# Patient Record
Sex: Male | Born: 1985
Health system: Southern US, Community
[De-identification: ages and names within clinical notes are randomized; demographics above are authoritative.]

## PROBLEM LIST (undated history)

## (undated) DIAGNOSIS — R011 Cardiac murmur, unspecified: Secondary | ICD-10-CM

## (undated) DIAGNOSIS — F1110 Opioid abuse, uncomplicated: Secondary | ICD-10-CM

## (undated) DIAGNOSIS — I499 Cardiac arrhythmia, unspecified: Secondary | ICD-10-CM

## (undated) HISTORY — PX: NO PAST SURGERIES: SHX2092

## (undated) HISTORY — DX: Cardiac arrhythmia, unspecified: I49.9

## (undated) HISTORY — DX: Cardiac murmur, unspecified: R01.1

---

## 2012-03-03 ENCOUNTER — Emergency Department: Payer: Self-pay | Admitting: Emergency Medicine

## 2017-09-07 ENCOUNTER — Other Ambulatory Visit: Payer: Self-pay

## 2017-09-07 ENCOUNTER — Encounter: Payer: Self-pay | Admitting: Emergency Medicine

## 2017-09-07 ENCOUNTER — Emergency Department
Admission: EM | Admit: 2017-09-07 | Discharge: 2017-09-07 | Disposition: A | Discharge status: Home / Self Care | Payer: BLUE CROSS/BLUE SHIELD | Attending: Emergency Medicine | Admitting: Emergency Medicine

## 2017-09-07 DIAGNOSIS — W5501XA Bitten by cat, initial encounter: Secondary | ICD-10-CM | POA: Insufficient documentation

## 2017-09-07 DIAGNOSIS — Z23 Encounter for immunization: Secondary | ICD-10-CM | POA: Insufficient documentation

## 2017-09-07 DIAGNOSIS — Z2914 Encounter for prophylactic rabies immune globin: Secondary | ICD-10-CM | POA: Diagnosis not present

## 2017-09-07 DIAGNOSIS — Y929 Unspecified place or not applicable: Secondary | ICD-10-CM | POA: Insufficient documentation

## 2017-09-07 DIAGNOSIS — Y939 Activity, unspecified: Secondary | ICD-10-CM | POA: Insufficient documentation

## 2017-09-07 DIAGNOSIS — S6991XA Unspecified injury of right wrist, hand and finger(s), initial encounter: Secondary | ICD-10-CM | POA: Diagnosis present

## 2017-09-07 DIAGNOSIS — Y999 Unspecified external cause status: Secondary | ICD-10-CM | POA: Diagnosis not present

## 2017-09-07 DIAGNOSIS — F17200 Nicotine dependence, unspecified, uncomplicated: Secondary | ICD-10-CM | POA: Insufficient documentation

## 2017-09-07 DIAGNOSIS — Z203 Contact with and (suspected) exposure to rabies: Secondary | ICD-10-CM

## 2017-09-07 DIAGNOSIS — S61431A Puncture wound without foreign body of right hand, initial encounter: Secondary | ICD-10-CM | POA: Diagnosis not present

## 2017-09-07 MED ORDER — RABIES IMMUNE GLOBULIN 150 UNIT/ML IM INJ
20.0000 [IU]/kg | INJECTION | Freq: Once | INTRAMUSCULAR | Status: AC
  Administered 2017-09-07: 1800 [IU] via INTRAMUSCULAR
  Filled 2017-09-07: qty 12

## 2017-09-07 MED ORDER — RABIES VACCINE, PCEC IM SUSR
1.0000 mL | Freq: Once | INTRAMUSCULAR | Status: AC
  Administered 2017-09-07: 1 mL via INTRAMUSCULAR
  Filled 2017-09-07: qty 1

## 2017-09-07 MED ORDER — TETANUS-DIPHTH-ACELL PERTUSSIS 5-2.5-18.5 LF-MCG/0.5 IM SUSP
0.5000 mL | Freq: Once | INTRAMUSCULAR | Status: AC
Start: 2017-09-07 — End: 2017-09-07
  Administered 2017-09-07: 0.5 mL via INTRAMUSCULAR
  Filled 2017-09-07: qty 0.5

## 2017-09-07 MED ORDER — AMOXICILLIN-POT CLAVULANATE 875-125 MG PO TABS: 1.0000 | ORAL_TABLET | Freq: Two times a day (BID) | ORAL | 0 refills | Status: DC

## 2017-09-07 NOTE — ED Provider Notes (Signed)
[facility] Emergency Department Provider Note  ____________________________________________  Time seen: Approximately 6:06 PM  I have reviewed the triage vital signs and the nursing notes.   HISTORY  Chief Complaint Animal Bite    HPI Paul Sanders is a 32 y.o. male who presents the emergency department complaining of multiple scratch and bite from a feral cat.  Patient reports that he was at a convenience store, went inside and when he had come out he was informed that I will fill It climbed into his engine block.  Patient had no other way of removing the animal except reaching and pulling them out.  In this process, the cat bit and scratched him multiple times on bilateral upper extremities.  Patient denies any erythema or edema, bleeding, purulent drainage from the sites.  He is concerned due to the unknown history of a cat with multiple wounds from same.  Patient is unsure of his last tetanus shot.  No other injury or complaint.  No medications for this complaint prior to arrival.  History reviewed. No pertinent past medical history.  There are no active problems to display for this patient.   History reviewed. No pertinent surgical history.  Prior to Admission medications   Medication Sig Start Date End Date Taking? Authorizing Provider  buprenorphine-naloxone (SUBOXONE) 8-2 mg SUBL SL tablet Place 1 tablet under the tongue daily.   Yes [provider]  amoxicillin-clavulanate (AUGMENTIN) 875-125 MG tablet Take 1 tablet by mouth 2 (two) times daily. 09/07/17   Dakari Stabler, Delorise Royals, PA-C    Allergies Patient has no known allergies.  History reviewed. No pertinent family history.  Social History Social History   Tobacco Use  . Smoking status: Current Every Day Smoker  . Smokeless tobacco: Never Used  Substance Use Topics  . Alcohol use: Never    Frequency: Never  . Drug use: Never     Review of Systems  Constitutional: No  fever/chills Eyes: No visual changes.  Cardiovascular: no chest pain. Respiratory: no cough. No SOB. Gastrointestinal: No abdominal pain.  No nausea, no vomiting.   Musculoskeletal: Negative for musculoskeletal pain. Skin: Positive for multiple bites from a feral cat to the bilateral hands, forearms. Neurological: Negative for headaches, focal weakness or numbness. 10-point ROS otherwise negative.  ____________________________________________   PHYSICAL EXAM:  VITAL SIGNS: ED Triage Vitals  Enc Vitals Group     BP 09/07/17 1743 117/75     Pulse Rate 09/07/17 1743 83     Resp 09/07/17 1743 16     Temp 09/07/17 1743 98.1 F (36.7 C)     Temp Source 09/07/17 1743 Oral     SpO2 09/07/17 1743 96 %     Weight 09/07/17 1742 200 lb (90.7 kg)     Height 09/07/17 1742  (1.803 m)     Head Circumference --      Peak Flow --      Pain Score 09/07/17 1741 0     Pain Loc --      Pain Edu? --      Excl. in GC? --      Constitutional: Alert and oriented. Well appearing and in no acute distress. Eyes: Conjunctivae are normal. PERRL. EOMI. Head: Atraumatic. Neck: No stridor.    Cardiovascular: Normal rate, regular rhythm. Normal S1 and S2.  Good peripheral circulation. Respiratory: Normal respiratory effort without tachypnea or retractions. Lungs CTAB. Good air entry to the bases with no decreased or absent breath sounds. Musculoskeletal:  Full range of motion to all extremities. No gross deformities appreciated. Neurologic:  Normal speech and language. No gross focal neurologic deficits are appreciated.  Skin:  Skin is warm, dry and intact. No rash noted.  Both superficial puncture wounds, scratches noted to bilateral hands and forearms.  No significant lacerations.  No wound requiring treatment.  All wounds have scabbed over at this time.  No surrounding erythema or edema.  Areas are nontender to palpation.  No purulent drainage from any. Psychiatric: Mood and affect are normal.  Speech and behavior are normal. Patient exhibits appropriate insight and judgement.   ____________________________________________   LABS (all labs ordered are listed, but only abnormal results are displayed)  Labs Reviewed - No data to display ____________________________________________  EKG   ____________________________________________  RADIOLOGY   No results found.  ____________________________________________    PROCEDURES  Procedure(s) performed:    Procedures    Medications  rabies vaccine (RABAVERT) injection 1 mL (has no administration in time range)  rabies immune globulin (HYPERAB/KEDRAB) injection 1,800 Units (has no administration in time range)  Tdap (BOOSTRIX) injection 0.5 mL (has no administration in time range)     ____________________________________________   INITIAL IMPRESSION / ASSESSMENT AND PLAN / ED COURSE  Pertinent labs & imaging results that were available during my care of the patient were reviewed by me and considered in my medical decision making (see chart for details).  Review of the [place] CSRS was performed in accordance of the [facility] prior to dispensing any controlled drugs.     Patient's diagnosis is consistent with cat bite/scratches.  Patient presents with multiple cat bites, scratches from a feral cat.  Patient is here for tetanus, rabies vaccination.  No wounds requiring immediate treatment.  Patient will be placed on antibiotics prophylactically.  Tetanus shot updated.. Patient will be discharged home with prescriptions for Augmentin.  Patient will return in 3 days for second rabies vaccination.. Patient is to follow up with [facility] as needed or otherwise directed. Patient is given ED precautions to return to the ED for any worsening or new symptoms.     ____________________________________________  FINAL CLINICAL IMPRESSION(S) / ED DIAGNOSES  Final diagnoses:  Cat bite, initial encounter  Need for post exposure  prophylaxis for rabies      NEW MEDICATIONS STARTED DURING THIS VISIT:  ED Discharge Orders        Ordered    amoxicillin-clavulanate (AUGMENTIN) 875-125 MG tablet  2 times daily     09/07/17 2109          This chart was dictated using voice recognition software/Dragon. Despite best efforts to proofread, errors can occur which can change the meaning. Any change was purely unintentional.    Racheal Patches, PA-C 09/07/17 2112    Sharyn Creamer, MD 09/08/17 [phone]

## 2017-09-07 NOTE — ED Notes (Addendum)
See triage note  States he was bitten by stray cat   Here for possible rabies injection  States the cat was under his car   When he tried to removed the cat he got several scratches and small bites to hand

## 2017-09-07 NOTE — ED Triage Notes (Signed)
FIRST NURSE NOTE-here for rabies, bit by stray cat

## 2017-09-07 NOTE — ED Triage Notes (Signed)
See first nurse note. 

## 2017-09-10 ENCOUNTER — Other Ambulatory Visit: Payer: Self-pay

## 2017-09-10 ENCOUNTER — Emergency Department
Admission: EM | Admit: 2017-09-10 | Discharge: 2017-09-10 | Disposition: A | Discharge status: Home / Self Care | Payer: BLUE CROSS/BLUE SHIELD | Attending: Emergency Medicine | Admitting: Emergency Medicine

## 2017-09-10 ENCOUNTER — Encounter: Payer: Self-pay | Admitting: Emergency Medicine

## 2017-09-10 DIAGNOSIS — Z79899 Other long term (current) drug therapy: Secondary | ICD-10-CM | POA: Diagnosis not present

## 2017-09-10 DIAGNOSIS — Z23 Encounter for immunization: Secondary | ICD-10-CM | POA: Insufficient documentation

## 2017-09-10 DIAGNOSIS — Z203 Contact with and (suspected) exposure to rabies: Secondary | ICD-10-CM | POA: Insufficient documentation

## 2017-09-10 DIAGNOSIS — F17200 Nicotine dependence, unspecified, uncomplicated: Secondary | ICD-10-CM | POA: Insufficient documentation

## 2017-09-10 MED ORDER — RABIES VACCINE, PCEC IM SUSR
1.0000 mL | Freq: Once | INTRAMUSCULAR | Status: AC
  Administered 2017-09-10: 1 mL via INTRAMUSCULAR
  Filled 2017-09-10: qty 1

## 2017-09-10 NOTE — ED Provider Notes (Signed)
[facility] Emergency Department Provider Note  ____________________________________________  Time seen: Approximately 4:47 PM  I have reviewed the triage vital signs and the nursing notes.   HISTORY  Chief Complaint Follow-up and Rabies Injection    HPI Paul Sanders is a 32 y.o. male who presents emergency department for his second rabies vaccination.  Patient was seen 3 days ago by myself after being bit and scratched by a feral cat to bilateral upper extremities.  See previous note for details.  Patient denies any complications with bite and scratch marks.  Patient is taking his antibiotics.  Patient denies any complaints with a previous rabies vaccine.  Patient is here for second shot only.  No other complaints.  History reviewed. No pertinent past medical history.  There are no active problems to display for this patient.   History reviewed. No pertinent surgical history.  Prior to Admission medications   Medication Sig Start Date End Date Taking? Authorizing Provider  amoxicillin-clavulanate (AUGMENTIN) 875-125 MG tablet Take 1 tablet by mouth 2 (two) times daily. 09/07/17   Cuthriell, Delorise Royals, PA-C  buprenorphine-naloxone (SUBOXONE) 8-2 mg SUBL SL tablet Place 1 tablet under the tongue daily.    [provider]    Allergies Patient has no known allergies.  No family history on file.  Social History Social History   Tobacco Use  . Smoking status: Current Every Day Smoker  . Smokeless tobacco: Never Used  Substance Use Topics  . Alcohol use: Never    Frequency: Never  . Drug use: Never     Review of Systems  Constitutional: No fever/chills Cardiovascular: no chest pain. Respiratory: no cough. No SOB. Gastrointestinal: No abdominal pain.  No nausea, no vomiting.   Musculoskeletal: Negative for musculoskeletal pain. Skin: Previous cat bite and scratches to the bilateral upper extremities Neurological: Negative for  headaches, focal weakness or numbness. 10-point ROS otherwise negative.  ____________________________________________   PHYSICAL EXAM:  VITAL SIGNS: ED Triage Vitals  Enc Vitals Group     BP 09/10/17 1644 132/79     Pulse Rate 09/10/17 1644 87     Resp 09/10/17 1644 16     Temp 09/10/17 1644 98.2 F (36.8 C)     Temp Source 09/10/17 1644 Oral     SpO2 09/10/17 1644 98 %     Weight 09/10/17 1641 200 lb (90.7 kg)     Height 09/10/17 1641  (1.778 m)     Head Circumference --      Peak Flow --      Pain Score --      Pain Loc --      Pain Edu? --      Excl. in GC? --      Constitutional: Alert and oriented. Well appearing and in no acute distress. Eyes: Conjunctivae are normal. PERRL. EOMI. Head: Atraumatic. Neck: No stridor.    Cardiovascular: Normal rate, regular rhythm. Normal S1 and S2.  Good peripheral circulation. Respiratory: Normal respiratory effort without tachypnea or retractions. Lungs CTAB. Good air entry to the bases with no decreased or absent breath sounds. Musculoskeletal: Full range of motion to all extremities. No gross deformities appreciated. Neurologic:  Normal speech and language. No gross focal neurologic deficits are appreciated.  Skin:  Skin is warm, dry and intact. No rash noted.  Well-healing wounds to bilateral upper extremity from previous Bite/scratch. Psychiatric: Mood and affect are normal. Speech and behavior are normal. Patient exhibits appropriate insight and judgement.  ____________________________________________   LABS (all labs ordered are listed, but only abnormal results are displayed)  Labs Reviewed - No data to display ____________________________________________  EKG   ____________________________________________  RADIOLOGY   No results found.  ____________________________________________    PROCEDURES  Procedure(s) performed:    Procedures    Medications  rabies vaccine (RABAVERT) injection 1 mL  (has no administration in time range)     ____________________________________________   INITIAL IMPRESSION / ASSESSMENT AND PLAN / ED COURSE  Pertinent labs & imaging results that were available during my care of the patient were reviewed by me and considered in my medical decision making (see chart for details).  Review of the [place] CSRS was performed in accordance of the [facility] prior to dispensing any controlled drugs.     Patient's diagnosis is consistent with encounter for rabies vaccination.  Patient is here for second injection.  No complications with first.  Patient still on antibiotics.  Wounds are healing appropriately with no signs of infection.  Second shot administered.  Patient will follow-up in 4 days for third shot.  No prescriptions at this time. Patient is given ED precautions to return to the ED for any worsening or new symptoms.     ____________________________________________  FINAL CLINICAL IMPRESSION(S) / ED DIAGNOSES  Final diagnoses:  Need for rabies vaccination      NEW MEDICATIONS STARTED DURING THIS VISIT:  ED Discharge Orders    None          This chart was dictated using voice recognition software/Dragon. Despite best efforts to proofread, errors can occur which can change the meaning. Any change was purely unintentional.    Racheal Patches, PA-C 09/10/17 1654    Phineas Semen, MD 09/10/17 2017

## 2017-09-10 NOTE — ED Triage Notes (Signed)
Follow up shot for rabies.

## 2017-09-14 ENCOUNTER — Other Ambulatory Visit: Payer: Self-pay

## 2017-09-14 ENCOUNTER — Encounter: Payer: Self-pay | Admitting: Emergency Medicine

## 2017-09-14 ENCOUNTER — Emergency Department
Admission: EM | Admit: 2017-09-14 | Discharge: 2017-09-14 | Disposition: A | Discharge status: Home / Self Care | Payer: BLUE CROSS/BLUE SHIELD | Attending: Emergency Medicine | Admitting: Emergency Medicine

## 2017-09-14 DIAGNOSIS — Z23 Encounter for immunization: Secondary | ICD-10-CM | POA: Diagnosis not present

## 2017-09-14 MED ORDER — RABIES VACCINE, PCEC IM SUSR
1.0000 mL | Freq: Once | INTRAMUSCULAR | Status: AC
  Administered 2017-09-14: 1 mL via INTRAMUSCULAR
  Filled 2017-09-14: qty 1

## 2017-09-14 NOTE — ED Provider Notes (Signed)
[facility] Emergency Department Provider Note  ____________________________________________   First MD Initiated Contact with Patient 09/14/17 1749     (approximate)  I have reviewed the triage vital signs and the nursing notes.   HISTORY  Chief Complaint Rabies Injection    HPI Paul Sanders is a 32 y.o. male resents emergency department for rabies injection #3.  He states he is to return next Thursday for repeat rabies vaccine.  He states he has not had any difficulty with the vaccines and has taken the antibiotics appropriately.  He states he does have a bruise on the left thigh due to the rabies vaccine  History reviewed. No pertinent past medical history.  There are no active problems to display for this patient.   History reviewed. No pertinent surgical history.  Prior to Admission medications   Medication Sig Start Date End Date Taking? Authorizing Provider  amoxicillin-clavulanate (AUGMENTIN) 875-125 MG tablet Take 1 tablet by mouth 2 (two) times daily. 09/07/17   Cuthriell, Delorise Royals, PA-C  buprenorphine-naloxone (SUBOXONE) 8-2 mg SUBL SL tablet Place 1 tablet under the tongue daily.    [provider]    Allergies Patient has no known allergies.  No family history on file.  Social History Social History   Tobacco Use  . Smoking status: Current Every Day Smoker  . Smokeless tobacco: Never Used  Substance Use Topics  . Alcohol use: Never    Frequency: Never  . Drug use: Never    Review of Systems  Constitutional: No fever/chills Eyes: No visual changes. ENT: No sore throat. Respiratory: Denies cough Genitourinary: Negative for dysuria. Musculoskeletal: Negative for back pain. Skin: Negative for rash.  Positive bruise to the left thigh due to vaccine    ____________________________________________   PHYSICAL EXAM:  VITAL SIGNS: ED Triage Vitals  Enc Vitals Group     BP 09/14/17 1745 131/83     Pulse  Rate 09/14/17 1745 80     Resp 09/14/17 1745 18     Temp 09/14/17 1745 98.2 F (36.8 C)     Temp Source 09/14/17 1745 Oral     SpO2 09/14/17 1745 99 %     Weight 09/14/17 1737 200 lb (90.7 kg)     Height 09/14/17 1737  (1.778 m)     Head Circumference --      Peak Flow --      Pain Score 09/14/17 1737 0     Pain Loc --      Pain Edu? --      Excl. in GC? --     Constitutional: Alert and oriented. Well appearing and in no acute distress. Eyes: Conjunctivae are normal.  Head: Atraumatic. Nose: No congestion/rhinnorhea. Mouth/Throat: Mucous membranes are moist.   Cardiovascular: Normal rate, regular rhythm. Respiratory: Normal respiratory effort.  No retractions GU: deferred Musculoskeletal: FROM all extremities, warm and well perfused Neurologic:  Normal speech and language.  Skin:  Skin is warm, dry and intact. No rash noted.  Bruise to the left thigh Psychiatric: Mood and affect are normal. Speech and behavior are normal.  ____________________________________________   LABS (all labs ordered are listed, but only abnormal results are displayed)  Labs Reviewed - No data to display ____________________________________________   ____________________________________________  RADIOLOGY    ____________________________________________   PROCEDURES  Procedure(s) performed: No  Procedures    ____________________________________________   INITIAL IMPRESSION / ASSESSMENT AND PLAN / ED COURSE  Pertinent labs & imaging results that were available  during my care of the patient were reviewed by me and considered in my medical decision making (see chart for details).  Patient is a 32 year old male presents emergency department for rabies vaccine #3.  He denies any issues with his previous vaccines and has been taking the antibiotic appropriately  On physical exam patient appears well, is afebrile, and a small bruise on the left thigh secondary to a injection of  the thigh.  Patient was instructed to return as scheduled next Thursday for repeat rabies vaccine.  He states he understands comply with instructions.  Was discharged in stable condition     As part of my medical decision making, I reviewed the following data within the electronic MEDICAL RECORD NUMBER Nursing notes reviewed and incorporated, Old chart reviewed, Notes from prior ED visits and [place] Controlled Substance Database  ____________________________________________   FINAL CLINICAL IMPRESSION(S) / ED DIAGNOSES  Final diagnoses:  Encounter for repeat administration of rabies vaccination      NEW MEDICATIONS STARTED DURING THIS VISIT:  New Prescriptions   No medications on file     Note:  This document was prepared using Dragon voice recognition software and may include unintentional dictation errors.    Faythe Ghee, PA-C 09/14/17 1755    Schaevitz, Myra Rude, MD 09/14/17 [phone]

## 2017-09-14 NOTE — ED Triage Notes (Signed)
Here for 3 rabies injection

## 2017-09-21 ENCOUNTER — Encounter: Payer: Self-pay | Admitting: Emergency Medicine

## 2017-09-21 ENCOUNTER — Other Ambulatory Visit: Payer: Self-pay

## 2017-09-21 ENCOUNTER — Emergency Department
Admission: EM | Admit: 2017-09-21 | Discharge: 2017-09-21 | Disposition: A | Discharge status: Home / Self Care | Payer: BLUE CROSS/BLUE SHIELD | Attending: Emergency Medicine | Admitting: Emergency Medicine

## 2017-09-21 DIAGNOSIS — Z23 Encounter for immunization: Secondary | ICD-10-CM | POA: Diagnosis not present

## 2017-09-21 DIAGNOSIS — Z203 Contact with and (suspected) exposure to rabies: Secondary | ICD-10-CM | POA: Insufficient documentation

## 2017-09-21 MED ORDER — RABIES VACCINE, PCEC IM SUSR
1.0000 mL | Freq: Once | INTRAMUSCULAR | Status: AC
  Administered 2017-09-21: 1 mL via INTRAMUSCULAR

## 2017-09-21 MED ORDER — RABIES VACCINE, PCEC IM SUSR
INTRAMUSCULAR | Status: AC
  Administered 2017-09-21: 1 mL via INTRAMUSCULAR
  Filled 2017-09-21: qty 1

## 2017-09-21 NOTE — ED Provider Notes (Signed)
[facility] Emergency Department Provider Note  ____________________________________________  Time seen: Approximately 7:59 PM  I have reviewed the triage vital signs and the nursing notes.   HISTORY  Chief Complaint Rabies Injection   HPI Paul Sanders is a 32 y.o. male who presents to the emergency department for his fourth and final rabies injection.  He states that he has not had any difficulty with the vaccinations and has taken his antibiotics as prescribed.  History reviewed. No pertinent past medical history.  There are no active problems to display for this patient.   History reviewed. No pertinent surgical history.  Prior to Admission medications   Medication Sig Start Date End Date Taking? Authorizing Provider  amoxicillin-clavulanate (AUGMENTIN) 875-125 MG tablet Take 1 tablet by mouth 2 (two) times daily. 09/07/17   Cuthriell, Delorise Royals, PA-C  buprenorphine-naloxone (SUBOXONE) 8-2 mg SUBL SL tablet Place 1 tablet under the tongue daily.    [provider]    Allergies Patient has no known allergies.  No family history on file.  Social History Social History   Tobacco Use  . Smoking status: Current Every Day Smoker  . Smokeless tobacco: Never Used  Substance Use Topics  . Alcohol use: Never    Frequency: Never  . Drug use: Never    Review of Systems Constitutional: Negative for fever. ENT: Negative for sore throat. Respiratory: Negative for cough or shortness of breath Gastrointestinal: No abdominal pain.  No nausea, no vomiting.  No diarrhea.  Musculoskeletal: Negative for generalized body aches. Skin: Negative for rash/lesion/wound. Neurological: Negative for headaches, focal weakness or numbness.  ____________________________________________   PHYSICAL EXAM:  VITAL SIGNS: ED Triage Vitals  Enc Vitals Group     BP 09/21/17 1857 138/83     Pulse Rate 09/21/17 1857 92     Resp 09/21/17 1857 18     Temp  09/21/17 1857 98.2 F (36.8 C)     Temp Source 09/21/17 1857 Oral     SpO2 09/21/17 1857 98 %     Weight 09/21/17 1858 200 lb (90.7 kg)     Height 09/21/17 1858  (1.778 m)     Head Circumference --      Peak Flow --      Pain Score 09/21/17 1858 0     Pain Loc --      Pain Edu? --      Excl. in GC? --     Constitutional: Alert and oriented. Well appearing and in no acute distress. Eyes: Conjunctivae are normal. PERRL. EOMI. Head: Atraumatic. Nose: No congestion/rhinnorhea. Mouth/Throat: Mucous membranes are moist. Neck: No stridor.  Cardiovascular: Normal rate, regular rhythm. Good peripheral circulation. Respiratory: Normal respiratory effort. Musculoskeletal: Full ROM throughout.  Neurologic:  Normal speech and language. No gross focal neurologic deficits are appreciated. Speech is normal. No gait instability. Skin:  Skin is warm, dry and intact. No rash noted. Psychiatric: Mood and affect are normal. Speech and behavior are normal.  ____________________________________________   LABS (all labs ordered are listed, but only abnormal results are displayed)  Labs Reviewed - No data to display ____________________________________________  EKG  Not indicated ____________________________________________  RADIOLOGY  Not indicated ____________________________________________   PROCEDURES  None ____________________________________________   INITIAL IMPRESSION / ASSESSMENT AND PLAN / ED COURSE  32 year old male presenting to the emergency department for his fourth and final rabies vaccination.  He was advised to follow-up with primary care or return to the emergency department for symptoms of concern.  Pertinent labs & imaging results that were available during my care of the patient were reviewed by me and considered in my medical decision making (see chart for details). ____________________________________________   FINAL CLINICAL IMPRESSION(S) / ED  DIAGNOSES  Final diagnoses:  Need for prophylactic vaccination and inoculation against rabies       Chinita Pester, FNP 09/21/17 2256    Sharman Cheek, MD 09/27/17 [phone]

## 2017-09-21 NOTE — ED Triage Notes (Signed)
Here for fourth rabies injection.  

## 2018-09-21 ENCOUNTER — Other Ambulatory Visit: Payer: Self-pay

## 2018-09-21 ENCOUNTER — Encounter: Payer: Self-pay | Admitting: Emergency Medicine

## 2018-09-21 ENCOUNTER — Ambulatory Visit
Admission: EM | Admit: 2018-09-21 | Discharge: 2018-09-21 | Disposition: A | Discharge status: Home / Self Care | Payer: BLUE CROSS/BLUE SHIELD | Attending: Family Medicine | Admitting: Family Medicine

## 2018-09-21 DIAGNOSIS — J029 Acute pharyngitis, unspecified: Secondary | ICD-10-CM

## 2018-09-21 DIAGNOSIS — H6692 Otitis media, unspecified, left ear: Secondary | ICD-10-CM

## 2018-09-21 HISTORY — DX: Opioid abuse, uncomplicated: F11.10

## 2018-09-21 LAB — RAPID STREP SCREEN (MED CTR MEBANE ONLY): Streptococcus, Group A Screen (Direct): NEGATIVE

## 2018-09-21 MED ORDER — AMOXICILLIN-POT CLAVULANATE 875-125 MG PO TABS: 1.0000 | ORAL_TABLET | Freq: Two times a day (BID) | ORAL | 0 refills | Status: DC

## 2018-09-21 NOTE — ED Triage Notes (Signed)
Patient c/o ear pain that started 1 week ago. Patient also c/o sore throat that started yesterday.

## 2018-09-21 NOTE — ED Provider Notes (Signed)
[facility] ____________________________________________  Time seen: Approximately 7:10 PM  I have reviewed the triage vital signs and the nursing notes.   HISTORY  Chief Complaint Otalgia (APPT) and Sore Throat   HPI Paul Sanders is a 33 y.o. male presenting for evaluation of left ear pain discomfort present for the last 1 week.  States the discomfort has been gradually increasing, particularly over the last 3 days.  Also reports yesterday the left side of his throat started to hurt as well.  States feels like there is some fluid in his left ear.  Denies hearing changes.  Denies injury or trauma.  States he has had occasional congestion going down into his chest, and rare cough.  Denies nasal congestion, sinus pain, fevers, chest pain, shortness of breath or known sick contacts.  Continues to eat and drink well.  Denies aggravating or alleviating factors.  Does work outside and has occasional allergy issues.     Past Medical History:  Diagnosis Date  . Opioid abuse (HCC)     There are no active problems to display for this patient.   History reviewed. No pertinent surgical history.   No current facility-administered medications for this encounter.   Current Outpatient Medications:  .  buprenorphine-naloxone (SUBOXONE) 8-2 mg SUBL SL tablet, Place 1 tablet under the tongue daily., Disp: , Rfl:  .  amoxicillin-clavulanate (AUGMENTIN) 875-125 MG tablet, Take 1 tablet by mouth every 12 (twelve) hours., Disp: 20 tablet, Rfl: 0  Allergies Patient has no known allergies.  History reviewed. No pertinent family history.  Social History Social History   Tobacco Use  . Smoking status: Former Games developer  . Smokeless tobacco: Never Used  Substance Use Topics  . Alcohol use: Never    Frequency: Never  . Drug use: Never    Review of Systems Constitutional: No fever ENT: Positive sore throat. Positive left ear discomfort.  Cardiovascular: Denies chest pain.  Respiratory: Denies shortness of breath. Gastrointestinal: No abdominal pain.    Musculoskeletal: Negative for back pain. Skin: Negative for rash.   ____________________________________________   PHYSICAL EXAM:  VITAL SIGNS: ED Triage Vitals  Enc Vitals Group     BP 09/21/18 1649 (!) 147/98     Pulse Rate 09/21/18 1649 80     Resp 09/21/18 1649 18     Temp 09/21/18 1649 98.2 F (36.8 C)     Temp Source 09/21/18 1649 Oral     SpO2 09/21/18 1649 100 %     Weight 09/21/18 1650 202 lb (91.6 kg)     Height 09/21/18 1650 5\' 10"  (1.778 m)     Head Circumference --      Peak Flow --      Pain Score 09/21/18 1650 3     Pain Loc --      Pain Edu? --      Excl. in GC? --     Constitutional: Alert and oriented. Well appearing and in no acute distress. Eyes: Conjunctivae are normal.  Head: Atraumatic. No sinus tenderness to palpation. No swelling. No erythema.  Ears: Left: Nontender, normal canal, moderate erythema and dull TM.  Right: Nontender, normal canal, no erythema, slight effusion, otherwise normal TM.  No mastoid tenderness bilaterally.  Nose: No nasal congestion.  Mouth/Throat: Mucous membranes are moist. No pharyngeal erythema. No tonsillar swelling or exudate.  Neck: No stridor.  No cervical spine tenderness to palpation. Hematological/Lymphatic/Immunilogical: Left anterior cervical lymphadenopathy noted.  No submandibular edema noted. Cardiovascular: Normal rate, regular rhythm.  Grossly normal heart sounds.  Good peripheral circulation. Respiratory: Normal respiratory effort.  No retractions. No wheezes, rales or rhonchi. Good air movement.  Musculoskeletal: Ambulatory with steady gait.  Neurologic:  Normal speech and language. No gait instability. Skin:  Skin appears warm, dry Psychiatric: Mood and affect are normal. Speech and behavior are normal. ___________________________________________   LABS (all labs ordered are listed, but only abnormal results are  displayed)  Labs Reviewed  RAPID STREP SCREEN ([facility] ONLY)  CULTURE, GROUP A STREP [facility])    PROCEDURES Procedures    INITIAL IMPRESSION / ASSESSMENT AND PLAN / ED COURSE  Pertinent labs & imaging results that were available during my care of the patient were reviewed by me and considered in my medical decision making (see chart for details).  Well-appearing patient.  No acute distress.  Strep negative, will culture.  Left otitis noted.  Will treat with oral Augmentin.  Over-the-counter Tylenol, ibuprofen, fluids and supportive care.  Also discussed over-the-counter Claritin or Zyrtec.  Discussed monitoring for other accompanying symptoms, including advice given regarding COVID-19 encouraged reevaluation for concerns. discussed indication, risks and benefits of medications with patient.  Discussed follow up with Primary care physician this week. Discussed follow up and return parameters including no resolution or any worsening concerns. Patient verbalized understanding and agreed to plan.   ____________________________________________   FINAL CLINICAL IMPRESSION(S) / ED DIAGNOSES  Final diagnoses:  Left otitis media, unspecified otitis media type  Pharyngitis, unspecified etiology     ED Discharge Orders         Ordered    amoxicillin-clavulanate (AUGMENTIN) 875-125 MG tablet  Every 12 hours     09/21/18 1727           Note: This dictation was prepared with Dragon dictation along with smaller phrase technology. Any transcriptional errors that result from this process are unintentional.         Renford DillsMiller, Farris Geiman, NP 09/21/18 1953

## 2018-09-21 NOTE — Discharge Instructions (Addendum)
Take medication as prescribed. Rest. Drink plenty of fluids.  Over-the-counter Claritin or Zyrtec.   Follow up with your primary care physician this week as needed. Return to Urgent care for new or worsening concerns.

## 2018-09-24 LAB — CULTURE, GROUP A STREP (THRC)

## 2018-10-07 ENCOUNTER — Ambulatory Visit
Admission: EM | Admit: 2018-10-07 | Discharge: 2018-10-07 | Disposition: A | Discharge status: Home / Self Care | Payer: BLUE CROSS/BLUE SHIELD | Source: Home / Self Care | Attending: Family Medicine | Admitting: Family Medicine

## 2018-10-07 ENCOUNTER — Emergency Department
Admission: EM | Admit: 2018-10-07 | Discharge: 2018-10-07 | Disposition: A | Discharge status: Home / Self Care | Payer: BLUE CROSS/BLUE SHIELD | Attending: Emergency Medicine | Admitting: Emergency Medicine

## 2018-10-07 ENCOUNTER — Encounter: Payer: Self-pay | Admitting: *Deleted

## 2018-10-07 ENCOUNTER — Other Ambulatory Visit: Payer: Self-pay

## 2018-10-07 ENCOUNTER — Emergency Department: Payer: BLUE CROSS/BLUE SHIELD

## 2018-10-07 DIAGNOSIS — R0789 Other chest pain: Secondary | ICD-10-CM

## 2018-10-07 DIAGNOSIS — Z87891 Personal history of nicotine dependence: Secondary | ICD-10-CM | POA: Insufficient documentation

## 2018-10-07 DIAGNOSIS — Z20828 Contact with and (suspected) exposure to other viral communicable diseases: Secondary | ICD-10-CM | POA: Insufficient documentation

## 2018-10-07 DIAGNOSIS — J029 Acute pharyngitis, unspecified: Secondary | ICD-10-CM

## 2018-10-07 DIAGNOSIS — R079 Chest pain, unspecified: Secondary | ICD-10-CM

## 2018-10-07 DIAGNOSIS — R0602 Shortness of breath: Secondary | ICD-10-CM | POA: Diagnosis present

## 2018-10-07 DIAGNOSIS — Z79899 Other long term (current) drug therapy: Secondary | ICD-10-CM | POA: Insufficient documentation

## 2018-10-07 LAB — BASIC METABOLIC PANEL
Anion gap: 9 (ref 5–15)
BUN: 18 mg/dL (ref 6–20)
CO2: 26 mmol/L (ref 22–32)
Calcium: 9.4 mg/dL (ref 8.9–10.3)
Chloride: 102 mmol/L (ref 98–111)
Creatinine, Ser: 0.84 mg/dL (ref 0.61–1.24)
GFR calc Af Amer: >60 mL/min (ref >60)
GFR calc non Af Amer: >60 mL/min (ref >60)
Glucose, Bld: 114 mg/dL — ABNORMAL HIGH (ref 70–99)
Potassium: 3.7 mmol/L (ref 3.5–5.1)
Sodium: 137 mmol/L (ref 135–145)

## 2018-10-07 LAB — CBC WITH DIFFERENTIAL/PLATELET
Abs Immature Granulocytes: 0.05 10*3/uL (ref 0.00–0.07)
Basophils Absolute: 0.1 10*3/uL (ref 0.0–0.1)
Basophils Relative: 1 %
Eosinophils Absolute: 0 10*3/uL (ref 0.0–0.5)
Eosinophils Relative: 0 %
HCT: 43.2 % (ref 39.0–52.0)
Hemoglobin: 15 g/dL (ref 13.0–17.0)
Immature Granulocytes: 1 %
Lymphocytes Relative: 12 %
Lymphs Abs: 1.3 10*3/uL (ref 0.7–4.0)
MCH: 29.5 pg (ref 26.0–34.0)
MCHC: 34.7 g/dL (ref 30.0–36.0)
MCV: 84.9 fL (ref 80.0–100.0)
Monocytes Absolute: 0.4 10*3/uL (ref 0.1–1.0)
Monocytes Relative: 4 %
Neutro Abs: 9.1 10*3/uL — ABNORMAL HIGH (ref 1.7–7.7)
Neutrophils Relative %: 82 %
Platelets: 232 10*3/uL (ref 150–400)
RBC: 5.09 MIL/uL (ref 4.22–5.81)
RDW: 11.9 % (ref 11.5–15.5)
WBC: 10.9 10*3/uL — ABNORMAL HIGH (ref 4.0–10.5)
nRBC: 0 % (ref 0.0–0.2)

## 2018-10-07 LAB — FIBRIN DERIVATIVES D-DIMER (ARMC ONLY): Fibrin derivatives D-dimer (ARMC): 223.27 ng/mL (FEU) (ref 0.00–499.00)

## 2018-10-07 LAB — CBC
HCT: 42.1 % (ref 39.0–52.0)
Hemoglobin: 14.8 g/dL (ref 13.0–17.0)
MCH: 29.4 pg (ref 26.0–34.0)
MCHC: 35.2 g/dL (ref 30.0–36.0)
MCV: 83.5 fL (ref 80.0–100.0)
Platelets: 225 10*3/uL (ref 150–400)
RBC: 5.04 MIL/uL (ref 4.22–5.81)
RDW: 12 % (ref 11.5–15.5)
WBC: 10.9 10*3/uL — ABNORMAL HIGH (ref 4.0–10.5)
nRBC: 0 % (ref 0.0–0.2)

## 2018-10-07 LAB — HEPATIC FUNCTION PANEL
ALT: 21 U/L (ref 0–44)
AST: 20 U/L (ref 15–41)
Albumin: 4.6 g/dL (ref 3.5–5.0)
Alkaline Phosphatase: 57 U/L (ref 38–126)
Bilirubin, Direct: <0.1 mg/dL (ref 0.0–0.2)
Total Bilirubin: 0.9 mg/dL (ref 0.3–1.2)
Total Protein: 7.5 g/dL (ref 6.5–8.1)

## 2018-10-07 LAB — GROUP A STREP BY PCR: Group A Strep by PCR: NOT DETECTED

## 2018-10-07 LAB — SARS CORONAVIRUS 2 BY RT PCR (HOSPITAL ORDER, PERFORMED IN ~~LOC~~ HOSPITAL LAB): SARS Coronavirus 2: NEGATIVE

## 2018-10-07 LAB — TROPONIN I: Troponin I: <0.03 ng/mL (ref <0.03)

## 2018-10-07 MED ORDER — NAPROXEN 500 MG PO TABS: 500.0000 mg | ORAL_TABLET | Freq: Two times a day (BID) | ORAL | 0 refills | Status: DC | PRN

## 2018-10-07 NOTE — ED Provider Notes (Signed)
[facility]    CSN: [id number] Arrival date & time: 10/07/18  1250  History   Chief Complaint Chief Complaint  Patient presents with  . Chest Pain  . Otalgia   HPI   33 year old male presents with the above complaints.  Patient reports that he woke up this morning and was not feeling well.  He reports left-sided chest pain and associated shoulder pain.  Worse when he takes a deep breath.  Patient states that he also "feels disoriented".  When asked to further explain this and he stated that he feels like he cannot focus.  He feels "shaky".  He has some tingling in his hands.  Patient states that he just does not feel well.  Patient also reports intermittent left ear pain.  He was treated for otitis media in May.  No fever.  No other reported symptoms.  His pain is currently mild.  No other complaints.  PMH, Surgical Hx, Family Hx, Social History reviewed and updated as below.  Past Medical History:  Diagnosis Date  . Opioid abuse [facility](HCC)    Past Surgical History:  Procedure Laterality Date  . NO PAST SURGERIES     Home Medications    Prior to Admission medications   Medication Sig Start Date End Date Taking? Authorizing Provider  buprenorphine-naloxone (SUBOXONE) 8-2 mg SUBL SL tablet Place 1 tablet under the tongue daily.   Yes [provider]  naproxen (NAPROSYN) 500 MG tablet Take 1 tablet (500 mg total) by mouth 2 (two) times daily as needed for moderate pain. 10/07/18   Tommie Samsook, Maurice Fotheringham G, DO   Social History Social History   Tobacco Use  . Smoking status: Former Games developermoker  . Smokeless tobacco: Never Used  Substance Use Topics  . Alcohol use: Never    Frequency: Never  . Drug use: Never    Allergies   Patient has no known allergies.  Review of Systems Review of Systems  HENT: Positive for ear pain.   Cardiovascular: Positive for chest pain.  Neurological:       Paresthesia.   Psychiatric/Behavioral: Positive for decreased concentration.    Physical Exam Triage Vital Signs ED Triage Vitals  Enc Vitals Group     BP 10/07/18 1301 (!) 147/89     Pulse Rate 10/07/18 1301 79     Resp 10/07/18 1301 16     Temp 10/07/18 1301 98 F (36.7 C)     Temp Source 10/07/18 1301 Oral     SpO2 10/07/18 1301 100 %     Weight 10/07/18 1300 202 lb (91.6 kg)     Height 10/07/18 1300 5\' 10"  (1.778 m)     Head Circumference --      Peak Flow --      Pain Score 10/07/18 1300 2     Pain Loc --      Pain Edu? --      Excl. in GC? --    Updated Vital Signs BP (!) 147/89 (BP Location: Right Arm)   Pulse 79   Temp 98 F (36.7 C) (Oral)   Resp 16   Ht 5\' 10"  (1.778 m)   Wt 91.6 kg   SpO2 100%   BMI 28.98 kg/m   Visual Acuity Right Eye Distance:   Left Eye Distance:   Bilateral Distance:    Right Eye Near:   Left Eye Near:    Bilateral Near:     Physical Exam Vitals signs and nursing note reviewed.  Constitutional:  Comments: Anxious appearing.  In no acute distress.  HENT:     Head: Normocephalic and atraumatic.  Eyes:     General:        Right eye: No discharge.        Left eye: No discharge.     Conjunctiva/sclera: Conjunctivae normal.  Cardiovascular:     Rate and Rhythm: Normal rate and regular rhythm.  Pulmonary:     Effort: Pulmonary effort is normal.     Breath sounds: Normal breath sounds.  Chest:     Chest wall: Tenderness present.  Neurological:     Mental Status: He is alert.  Psychiatric:        Behavior: Behavior normal.     Comments: Flat affect.  Anxious.    UC Treatments / Results  Labs (all labs ordered are listed, but only abnormal results are displayed) Labs Reviewed - No data to display  EKG Interpretation: Normal sinus rhythm with rate of 81.  Normal axis.  Normal intervals.  No ST-T wave changes.  Normal EKG.  Radiology Dg Chest Portable 1 View  Result Date: 10/07/2018 CLINICAL DATA:  Shortness of breath. Opioid abuse. EXAM: PORTABLE CHEST 1 VIEW COMPARISON:  None. FINDINGS:  Midline trachea. Normal heart size and mediastinal contours. No pleural effusion or pneumothorax. Diffuse peribronchial thickening. No lobar consolidation. IMPRESSION: 1. No acute cardiopulmonary disease. 2. Mild peribronchial thickening which may relate to chronic bronchitis or smoking. Electronically Signed   By: Abigail Miyamoto M.D.   On: 10/07/2018 15:00    Procedures Procedures (including critical care time)  Medications Ordered in UC Medications - No data to display  Initial Impression / Assessment and Plan / UC Course  I have reviewed the triage vital signs and the nursing notes.  Pertinent labs & imaging results that were available during my care of the patient were reviewed by me and considered in my medical decision making (see chart for details).    33 year old male presents with multiple complaints.  Patient appears to be experiencing chest wall pain.  EKG normal.  Regarding his other symptoms, this could be secondary to stress/anxiety.  Patient endorses compliance with Suboxone.  Naproxen as directed.  Supportive care.  Final Clinical Impressions(s) / UC Diagnoses   Final diagnoses:  Chest wall pain     Discharge Instructions     Rest. Medication as needed.   Take care  Dr. Lacinda Axon     ED Prescriptions    Medication Sig Dispense Auth. Provider   naproxen (NAPROSYN) 500 MG tablet Take 1 tablet (500 mg total) by mouth 2 (two) times daily as needed for moderate pain. 30 tablet Coral Spikes, DO     Controlled Substance Prescriptions [place] Controlled Substance Registry consulted? Not Applicable   Coral Spikes, DO 10/07/18 1518

## 2018-10-07 NOTE — ED Provider Notes (Signed)
[facility] Emergency Department Provider Note   ____________________________________________   First MD Initiated Contact with Patient 10/07/18 1517     (approximate)  I have reviewed the triage vital signs and the nursing notes.   HISTORY  Chief Complaint Chest Pain and Shortness of Breath  HPI Paul Sanders is a 33 y.o. male who reports he woke up this morning feeling poorly.  He started with a sore throat on the left side of his throat little bit of pain when he swallowed this progressed to left-sided chest pain and a feeling of some shortness of breath.  Sometimes the pain is little sharp when he takes a deep breath.  Does not seem to change with exertion.  Coworkers wife apparently tested positive for coronavirus.  The coworker himself was negative patient was seen at the urgent care earlier today where he also complained of earache.  Patient does not have any urine currently.  He does seem a little bit anxious.  He reports he took some colloidal silver preparation to help boost his immune system that he bought over-the-counter.         Past Medical History:  Diagnosis Date  . Opioid abuse (HCC)     There are no active problems to display for this patient.   Past Surgical History:  Procedure Laterality Date  . NO PAST SURGERIES      Prior to Admission medications   Medication Sig Start Date End Date Taking? Authorizing Provider  buprenorphine-naloxone (SUBOXONE) 8-2 mg SUBL SL tablet Place 1 tablet under the tongue daily.    [provider]  naproxen (NAPROSYN) 500 MG tablet Take 1 tablet (500 mg total) by mouth 2 (two) times daily as needed for moderate pain. 10/07/18   Tommie Samsook, Jayce G, DO    Allergies Patient has no known allergies.  History reviewed. No pertinent family history.  Social History Social History   Tobacco Use  . Smoking status: Former Games developermoker  . Smokeless tobacco: Never Used  Substance Use Topics  . Alcohol  use: Never    Frequency: Never  . Drug use: Never    Review of Systems  Constitutional: No fever/chills Eyes: No visual changes. ENT:  sore throat. Cardiovascular:  chest pain. Respiratory: shortness of breath. Gastrointestinal: No abdominal pain.  No nausea, no vomiting.  No diarrhea.  No constipation. Genitourinary: Negative for dysuria. Musculoskeletal: Negative for back pain. Skin: Negative for rash. Neurological: Negative for headaches, focal weakness   ____________________________________________   PHYSICAL EXAM:  VITAL SIGNS: ED Triage Vitals [10/07/18 1414]  Enc Vitals Group     BP 121/82     Pulse Rate 73     Resp 16     Temp 98.1 F (36.7 C)     Temp Source Oral     SpO2 100 %     Weight 202 lb (91.6 kg)     Height 5\' 10"  (1.778 m)     Head Circumference      Peak Flow      Pain Score 4     Pain Loc      Pain Edu?      Excl. in GC?     Constitutional: Alert and oriented. Well appearing and in no acute distress. Eyes: Conjunctivae are normal. PERRL. EOMI. Head: Atraumatic. Nose: No congestion/rhinnorhea. Mouth/Throat: Mucous membranes are moist.  Oropharynx non-erythematous. Neck: No stridor.   Hematological/Lymphatic/Immunilogical: Tender area with possible slight shotty cervical lymphadenopathy in the left upper neck. Cardiovascular:  Normal rate, regular rhythm. Grossly normal heart sounds.  Good peripheral circulation. Respiratory: Normal respiratory effort.  No retractions. Lungs CTAB. Gastrointestinal: Soft and nontender. No distention. No abdominal bruits. No CVA tenderness. Musculoskeletal: No lower extremity tenderness nor edema.  Neurologic:  Normal speech and language. No gross focal neurologic deficits are appreciated. Skin:  Skin is warm, dry and intact. No rash noted.  ____________________________________________   LABS (all labs ordered are listed, but only abnormal results are displayed)  Labs Reviewed  BASIC METABOLIC PANEL -  Abnormal; Notable for the following components:      Result Value   Glucose, Bld 114 (*)    All other components within normal limits  CBC - Abnormal; Notable for the following components:   WBC 10.9 (*)    All other components within normal limits  CBC WITH DIFFERENTIAL/PLATELET - Abnormal; Notable for the following components:   WBC 10.9 (*)    Neutro Abs 9.1 (*)    All other components within normal limits  SARS CORONAVIRUS 2 (HOSPITAL ORDER, [place] [facility])  GROUP A STREP BY PCR  TROPONIN I  HEPATIC FUNCTION PANEL  FIBRIN DERIVATIVES D-DIMER ([facility] ONLY)   ____________________________________________  EKG  EKG read interpreted by me shows normal sinus rhythm rate of 72 normal axis essentially normal EKG ____________________________________________  RADIOLOGY  ED MD interpretation: Chest x-ray read read by radiology reviewed by me shows no acute disease radiologist as peribronchial thickening which may be related to chronic bronchitis or smoking.  Official radiology report(s): Dg Chest Portable 1 View  Result Date: 10/07/2018 CLINICAL DATA:  Shortness of breath. Opioid abuse. EXAM: PORTABLE CHEST 1 VIEW COMPARISON:  None. FINDINGS: Midline trachea. Normal heart size and mediastinal contours. No pleural effusion or pneumothorax. Diffuse peribronchial thickening. No lobar consolidation. IMPRESSION: 1. No acute cardiopulmonary disease. 2. Mild peribronchial thickening which may relate to chronic bronchitis or smoking. Electronically Signed   By: Abigail Miyamoto M.D.   On: 10/07/2018 15:00    ____________________________________________   PROCEDURES  Procedure(s) performed (including Critical Care):  Procedures   ____________________________________________   INITIAL IMPRESSION / ASSESSMENT AND PLAN / ED COURSE Patient is tested including coronavirus are negative.  Patient feels better wants to go home.  I will let him go home he will return for any  further problems.  I will have him follow-up with cardiology just in case although I think he is low risk.              ____________________________________________   FINAL CLINICAL IMPRESSION(S) / ED DIAGNOSES  Final diagnoses:  Chest pain, unspecified type  Sore throat     ED Discharge Orders    None       Note:  This document was prepared using Dragon voice recognition software and may include unintentional dictation errors.    Nena Polio, MD 10/08/18 [phone]

## 2018-10-07 NOTE — Discharge Instructions (Signed)
Rest.  Medication as needed.  Take care  Dr. Carizma Dunsworth  

## 2018-10-07 NOTE — ED Notes (Signed)
Pt given a cup of water to drink. Pt wanting an update. Notified that covid is negative. Waiting on MD for d/c.

## 2018-10-07 NOTE — Discharge Instructions (Addendum)
The test we did today are negative including the coronavirus test.  EKG and heart blood test and chest x-ray were all good.  Just in case I want you to follow-up with a cardiologist.  Dr. Nehemiah Massed is on-call.  He is very good.  Please give his office a call in the morning let them know you are in the emergency room with chest pain and they should be out of see very quickly.  Please return here if you are worse at all.

## 2018-10-07 NOTE — ED Triage Notes (Addendum)
Patient complains of left sided chest and shoulder pain, worse with taking a deep breath, feels "disoriented" and shaking. Patient reports that hands and feet feel clammy and tingling. Patient states that he has been having this since this morning, symptoms have been off and on.   Patient also complains of left ear pain that has been ongoing for weeks. Patient states that he was seen here for this.

## 2018-10-07 NOTE — ED Triage Notes (Signed)
PT to ED reporting sore throat x 3-4 days that has progressed into left sided chest pain, weakness, SOB and fatigue today. No vomiting and no fevers at this time. Pt reports a coworkers wife tested positive for COVID.

## 2018-10-07 NOTE — ED Notes (Signed)
Patient AAOX4. Vitals Stable. NAD. 

## 2018-11-13 ENCOUNTER — Other Ambulatory Visit: Payer: Self-pay

## 2018-11-13 ENCOUNTER — Encounter: Payer: Self-pay | Admitting: Emergency Medicine

## 2018-11-13 DIAGNOSIS — G43809 Other migraine, not intractable, without status migrainosus: Secondary | ICD-10-CM | POA: Insufficient documentation

## 2018-11-13 DIAGNOSIS — Z87891 Personal history of nicotine dependence: Secondary | ICD-10-CM | POA: Insufficient documentation

## 2018-11-13 DIAGNOSIS — R51 Headache: Secondary | ICD-10-CM | POA: Diagnosis present

## 2018-11-13 LAB — COMPREHENSIVE METABOLIC PANEL
ALT: 20 U/L (ref 0–44)
AST: 20 U/L (ref 15–41)
Albumin: 4.4 g/dL (ref 3.5–5.0)
Alkaline Phosphatase: 49 U/L (ref 38–126)
Anion gap: 10 (ref 5–15)
BUN: 17 mg/dL (ref 6–20)
CO2: 27 mmol/L (ref 22–32)
Calcium: 9.1 mg/dL (ref 8.9–10.3)
Chloride: 104 mmol/L (ref 98–111)
Creatinine, Ser: 0.79 mg/dL (ref 0.61–1.24)
GFR calc Af Amer: >60 mL/min (ref >60)
GFR calc non Af Amer: >60 mL/min (ref >60)
Glucose, Bld: 135 mg/dL — ABNORMAL HIGH (ref 70–99)
Potassium: 3.6 mmol/L (ref 3.5–5.1)
Sodium: 141 mmol/L (ref 135–145)
Total Bilirubin: 0.9 mg/dL (ref 0.3–1.2)
Total Protein: 6.8 g/dL (ref 6.5–8.1)

## 2018-11-13 LAB — CBC
HCT: 39.3 % (ref 39.0–52.0)
Hemoglobin: 13.7 g/dL (ref 13.0–17.0)
MCH: 29.8 pg (ref 26.0–34.0)
MCHC: 34.9 g/dL (ref 30.0–36.0)
MCV: 85.6 fL (ref 80.0–100.0)
Platelets: 203 10*3/uL (ref 150–400)
RBC: 4.59 MIL/uL (ref 4.22–5.81)
RDW: 12.2 % (ref 11.5–15.5)
WBC: 7.1 10*3/uL (ref 4.0–10.5)
nRBC: 0 % (ref 0.0–0.2)

## 2018-11-13 NOTE — ED Triage Notes (Signed)
Pt presents to ED c/o migraine headache for several days but worse today. Pt reports he works in [employer] in the heat and has been trying to orally hydrate but thinks he may be dehydrated. Took tylenol around 1145.

## 2018-11-14 ENCOUNTER — Emergency Department
Admission: EM | Admit: 2018-11-14 | Discharge: 2018-11-14 | Disposition: A | Discharge status: Home / Self Care | Payer: BLUE CROSS/BLUE SHIELD | Attending: Emergency Medicine | Admitting: Emergency Medicine

## 2018-11-14 DIAGNOSIS — G43809 Other migraine, not intractable, without status migrainosus: Secondary | ICD-10-CM

## 2018-11-14 MED ORDER — SODIUM CHLORIDE 0.9 % IV BOLUS
1000.0000 mL | Freq: Once | INTRAVENOUS | Status: AC
  Administered 2018-11-14: 01:00:00 1000 mL via INTRAVENOUS

## 2018-11-14 MED ORDER — KETOROLAC TROMETHAMINE 30 MG/ML IJ SOLN
30.0000 mg | Freq: Once | INTRAMUSCULAR | Status: AC
  Administered 2018-11-14: 01:00:00 30 mg via INTRAVENOUS
  Filled 2018-11-14: qty 1

## 2018-11-14 MED ORDER — PROCHLORPERAZINE EDISYLATE 10 MG/2ML IJ SOLN
10.0000 mg | Freq: Once | INTRAMUSCULAR | Status: AC
  Administered 2018-11-14: 01:00:00 10 mg via INTRAVENOUS
  Filled 2018-11-14: qty 2

## 2018-11-14 NOTE — ED Provider Notes (Signed)
[facility] Emergency Department Provider Note _____   First MD Initiated Contact with Patient 11/14/18 0009     (approximate)  I have reviewed the triage vital signs and the nursing notes.   HISTORY  Chief Complaint Migraine   HPI Paul Sanders is a 33 y.o. male presents to the emergency department with a 1 day history of migraine headache generalized muscle cramps feeling dehydrated.  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  Patient denies any focal weakness no numbness gait instability or visual changes.  Patient does admit to photosensitivity.  Patient states that headache is consistent with previous migraines.        Past Medical History:  Diagnosis Date   Opioid abuse ([place])     There are no active problems to display for this patient.   Past Surgical History:  Procedure Laterality Date   NO PAST SURGERIES      Prior to Admission medications   Medication Sig Start Date End Date Taking? Authorizing Provider  buprenorphine-naloxone (SUBOXONE) 8-2 mg SUBL SL tablet Place 1 tablet under the tongue daily.    [provider]  naproxen (NAPROSYN) 500 MG tablet Take 1 tablet (500 mg total) by mouth 2 (two) times daily as needed for moderate pain. 10/07/18   Coral Spikes, DO    Allergies Patient has no known allergies.  History reviewed. No pertinent family history.  Social History Social History   Tobacco Use   Smoking status: Former Smoker    Types: E-cigarettes   Smokeless tobacco: Never Used  Substance Use Topics   Alcohol use: Never    Frequency: Never   Drug use: Never    Review of Systems Constitutional: No fever/chills Eyes: No visual changes. ENT: No sore throat. Cardiovascular: Denies chest pain. Respiratory: Denies shortness of breath. Gastrointestinal: No abdominal pain.  No nausea, no vomiting.  No diarrhea.  No  constipation. Genitourinary: Negative for dysuria. Musculoskeletal: Negative for neck pain.  Negative for back pain. Integumentary: Negative for rash. Neurological: Positive for headaches, negative for focal weakness or numbness.  ____________________________________________   PHYSICAL EXAM:  VITAL SIGNS: ED Triage Vitals  Enc Vitals Group     BP 11/13/18 1859 138/77     Pulse Rate 11/13/18 1859 81     Resp 11/13/18 1859 18     Temp 11/13/18 1859 98.7 F (37.1 C)     Temp Source 11/13/18 1859 Oral     SpO2 11/13/18 1859 100 %     Weight 11/13/18 1859 86.2 kg (190 lb)     Height 11/13/18 1859 1.778 m (5\' 10" )     Head Circumference --      Peak Flow --      Pain Score 11/13/18 1901 3     Pain Loc --      Pain Edu? --      Excl. in [place]? --     Constitutional: Alert and oriented. Well appearing and in no acute distress. Eyes: Conjunctivae are normal. PERRL. EOMI. Mouth/Throat: Mucous membranes are moist. Oropharynx non-erythematous. Neck: No stridor.   Cardiovascular: Normal rate, regular rhythm. Good peripheral circulation. Grossly normal heart sounds. Respiratory: Normal respiratory effort.  No retractions. No audible wheezing. Gastrointestinal: Soft and nontender. No distention.  Musculoskeletal: No lower extremity tenderness nor edema. No gross deformities of extremities. Neurologic:  Normal speech and language. No gross focal neurologic deficits are appreciated.  Skin:  Skin is warm, dry and intact. No rash noted. Psychiatric: Mood and affect are normal. Speech and behavior are normal.  ____________________________________________   LABS (all labs ordered are listed, but only abnormal results are displayed)  Labs Reviewed  COMPREHENSIVE METABOLIC PANEL - Abnormal; Notable for the following components:      Result Value   Glucose, Bld 135 (*)    All other components within normal limits  CBC     Procedures   ____________________________________________   INITIAL IMPRESSION / MDM / ASSESSMENT AND PLAN / ED COURSE  As part of my medical decision making, I reviewed the following data within the electronic MEDICAL RECORD NUMBER   33 year old male presenting with above-stated history and physical exam consistent with possible migraine headache and dehydration.  Patient given 2 L IV normal saline Toradol 30 mg and Compazine with complete resolution of headache.  On reevaluation patient states I feel much better".      ____________________________________________  FINAL CLINICAL IMPRESSION(S) / ED DIAGNOSES  Final diagnoses:  Other migraine without status migrainosus, not intractable     MEDICATIONS GIVEN DURING THIS VISIT:  Medications  sodium chloride 0.9 % bolus 1,000 mL (has no administration in time range)  sodium chloride 0.9 % bolus 1,000 mL (has no administration in time range)     ED Discharge Orders    None      *Please note:  Paul Sanders was evaluated in Emergency Department on 11/14/2018 for the symptoms described in the history of present illness. He was evaluated in the context of the global COVID-19 pandemic, which necessitated consideration that the patient might be at risk for infection with the SARS-CoV-2 virus that causes COVID-19. Institutional protocols and algorithms that pertain to the evaluation of patients at risk for COVID-19 are in a state of rapid change based on information released by regulatory bodies including the CDC and federal and state organizations. These policies and algorithms were followed during the patient's care in the ED.  Some ED evaluations and interventions may be delayed as a result of limited staffing during the pandemic.*  Note:  This document was prepared using Dragon voice recognition software and may include unintentional dictation errors.   Darci CurrentBrown, [place] N, MD 11/14/18 [phone]

## 2020-07-10 ENCOUNTER — Ambulatory Visit: Payer: BLUE CROSS/BLUE SHIELD

## 2020-07-10 ENCOUNTER — Ambulatory Visit: Payer: Self-pay

## 2020-07-10 ENCOUNTER — Ambulatory Visit (INDEPENDENT_AMBULATORY_CARE_PROVIDER_SITE_OTHER): Payer: BLUE CROSS/BLUE SHIELD

## 2020-07-10 ENCOUNTER — Ambulatory Visit
Admission: EM | Admit: 2020-07-10 | Discharge: 2020-07-10 | Disposition: A | Discharge status: Home / Self Care | Payer: BLUE CROSS/BLUE SHIELD | Attending: Emergency Medicine | Admitting: Emergency Medicine

## 2020-07-10 ENCOUNTER — Other Ambulatory Visit: Payer: Self-pay

## 2020-07-10 DIAGNOSIS — T2110XS Burn of first degree of trunk, unspecified site, sequela: Secondary | ICD-10-CM

## 2020-07-10 DIAGNOSIS — T3399XS Superficial frostbite of other sites, sequela: Secondary | ICD-10-CM

## 2020-07-10 DIAGNOSIS — R0781 Pleurodynia: Secondary | ICD-10-CM

## 2020-07-10 DIAGNOSIS — T2150XS Corrosion of first degree of trunk, unspecified site, sequela: Secondary | ICD-10-CM

## 2020-07-10 DIAGNOSIS — S20212A Contusion of left front wall of thorax, initial encounter: Secondary | ICD-10-CM

## 2020-07-10 DIAGNOSIS — W010XXA Fall on same level from slipping, tripping and stumbling without subsequent striking against object, initial encounter: Secondary | ICD-10-CM | POA: Diagnosis not present

## 2020-07-10 MED ORDER — IBUPROFEN 600 MG PO TABS: 600.0000 mg | ORAL_TABLET | Freq: Four times a day (QID) | ORAL | 0 refills | Status: DC | PRN

## 2020-07-10 NOTE — ED Provider Notes (Signed)
[facility]    CSN: [id number] Arrival date & time: 07/10/20  0948      History   Chief Complaint Chief Complaint  Patient presents with  . Rib Injury    Left side  . chest congestion    HPI NATHEN BALABAN is a 35 y.o. male.   HPI   35 year old male here for evaluation of left rib pain.  Patient reports that 3 weeks ago he was running through his house while wearing socks and slipped on the hardwood floor.  He reports that his hands went up and he landed on the left side of his chest and back.  Patient reports that she has had pain ever since then.  The pain was getting better but then over the last few days the pain has been getting worse.  The pain is worse with movement and deep breathing and also with heavy lifting.  Patient reports that he has had some chest congestion and a mild cough but denies hemoptysis and denies shortness of breath.  Additionally, patient has a silver dollar sized area of redness on his back from where he had an ice pack last night directly on the skin.  Past Medical History:  Diagnosis Date  . Opioid abuse ([facility])     There are no problems to display for this patient.   Past Surgical History:  Procedure Laterality Date  . NO PAST SURGERIES         Home Medications    Prior to Admission medications   Medication Sig Start Date End Date Taking? Authorizing Provider  buprenorphine-naloxone (SUBOXONE) 8-2 mg SUBL SL tablet Place 1 tablet under the tongue daily.   Yes [provider]  ibuprofen (ADVIL) 600 MG tablet Take 1 tablet (600 mg total) by mouth every 6 (six) hours as needed. 07/10/20  Yes Becky Augusta, NP    Family History History reviewed. No pertinent family history.  Social History Social History   Tobacco Use  . Smoking status: Former Smoker    Types: E-cigarettes  . Smokeless tobacco: Never Used  Vaping Use  . Vaping Use: Every day  . Substances: Nicotine  Substance Use Topics  . Alcohol use:  Never  . Drug use: Never     Allergies   Patient has no known allergies.   Review of Systems Review of Systems  Constitutional: Negative for activity change, appetite change and fever.  Respiratory: Positive for cough. Negative for shortness of breath and wheezing.   Cardiovascular: Positive for chest pain.  Skin: Positive for color change.  Neurological: Negative for syncope.  Hematological: Negative.   Psychiatric/Behavioral: Negative.      Physical Exam Triage Vital Signs ED Triage Vitals  Enc Vitals Group     BP 07/10/20 1007 (!) 145/90     Pulse Rate 07/10/20 1007 86     Resp 07/10/20 1007 18     Temp 07/10/20 1007 98.3 F (36.8 C)     Temp Source 07/10/20 1007 Oral     SpO2 07/10/20 1007 100 %     Weight 07/10/20 1005 212 lb (96.2 kg)     Height 07/10/20 1005 5\' 10"  (1.778 m)     Head Circumference --      Peak Flow --      Pain Score 07/10/20 1003 8     Pain Loc --      Pain Edu? --      Excl. in [place]? --    No data  found.  Updated Vital Signs BP (!) 145/90 (BP Location: Left Arm)   Pulse 86   Temp 98.3 F (36.8 C) (Oral)   Resp 18   Ht 5\' 10"  (1.778 m)   Wt 212 lb (96.2 kg)   SpO2 100%   BMI 30.42 kg/m   Visual Acuity Right Eye Distance:   Left Eye Distance:   Bilateral Distance:    Right Eye Near:   Left Eye Near:    Bilateral Near:     Physical Exam Vitals and nursing note reviewed.  Constitutional:      General: He is not in acute distress.    Appearance: Normal appearance. He is normal weight. He is not ill-appearing.  HENT:     Head: Normocephalic and atraumatic.  Cardiovascular:     Rate and Rhythm: Normal rate and regular rhythm.     Pulses: Normal pulses.     Heart sounds: Normal heart sounds. No murmur heard. No gallop.   Pulmonary:     Effort: Pulmonary effort is normal.     Breath sounds: Normal breath sounds. No wheezing, rhonchi or rales.  Chest:     Chest wall: Tenderness present.  Musculoskeletal:        General:  Tenderness present. No swelling. Normal range of motion.  Skin:    General: Skin is warm and dry.     Capillary Refill: Capillary refill takes less than 2 seconds.     Findings: Erythema present. No bruising or rash.  Neurological:     General: No focal deficit present.     Mental Status: He is alert and oriented to person, place, and time.  Psychiatric:        Mood and Affect: Mood normal.        Behavior: Behavior normal.        Thought Content: Thought content normal.        Judgment: Judgment normal.      UC Treatments / Results  Labs (all labs ordered are listed, but only abnormal results are displayed) Labs Reviewed - No data to display  EKG   Radiology DG Ribs Unilateral W/Chest Left  Result Date: 07/10/2020 CLINICAL DATA:  Pain following fall EXAM: LEFT RIBS AND CHEST - 3+ VIEW COMPARISON:  Chest radiograph October 07, 2018 FINDINGS: Frontal chest as well as oblique and cone-down rib images were obtained. Lungs are clear. Heart size and pulmonary vascularity are normal. No adenopathy. No evident pneumothorax or pleural effusion. No appreciable rib fracture. IMPRESSION: No evident rib fracture.  Lungs clear. Electronically Signed   By: October 09, 2018 III M.D.   On: 07/10/2020 10:55    Procedures Procedures (including critical care time)  Medications Ordered in UC Medications - No data to display  Initial Impression / Assessment and Plan / UC Course  I have reviewed the triage vital signs and the nursing notes.  Pertinent labs & imaging results that were available during my care of the patient were reviewed by me and considered in my medical decision making (see chart for details).   Is a very pleasant 35 year old male here for evaluation of posterior left-sided rib pain that started 3 weeks ago.  Patient's initial injury was a fall from a standing height onto hardwood floor.  Patient is not in any acute distress, can speak in full sentences, and is not tachypneic.   Heart sounds are normal and lung sounds are clear to auscultation all fields.  Patient does have tenderness with palpation of the  sixth through 10th ribs posteriorly.  Overlying the seventh eighth and ninth rib there is a silver dollar sized area of redness that is warm from where patient states that he put an ice pack directly on his skin last night.  This redness is consistent with first-degree frostbite of the skin.  Will have patient avoid placing ice over the injured area and have him continue to monitor it for any signs of worsening to include, blistering, darkening of the area, fever, or drainage.  Will radiograph left ribs to look for fracture.  Radiograph series of left ribs independently reviewed by me.  Interpretation: Lung fields are clear there is no evidence of pneumonia or infiltrate.  No pneumothorax.  No evidence of rib fracture.  Awaiting radiology overread. Radiology interpretation coincides with mine.  Will discharge patient home with diagnosis of posterior chest wall contusion.  Will give prescription for ibuprofen 600 mg for inflammation and pain.  Patient is a recovering addict and does not wish to have narcotics.  We will also provide work note for light duty.   Final Clinical Impressions(s) / UC Diagnoses   Final diagnoses:  Contusion of chest wall, left, initial encounter  Sequelae of burn, corrosion and frostbite of trunk, first degree     Discharge Instructions     Take the ibuprofen, 600 mg every 6 hours with food, as needed for pain and inflammation.  Avoid applying ice directly on your skin so that you do not cause further injury.  Refrain from placing ice over top of your current area of frostbite.  Perform deep breathing exercises with 10 deep breaths every couple hours while awake to prevent pneumonia from forming.  If you have increasing redness, heat from the area of frostbite, drainage, or darkening of the skin you need to return for reevaluation or go to  the emergency department.    ED Prescriptions    Medication Sig Dispense Auth. Provider   ibuprofen (ADVIL) 600 MG tablet Take 1 tablet (600 mg total) by mouth every 6 (six) hours as needed. 30 tablet Becky Augusta, NP     PDMP not reviewed this encounter.   Becky Augusta, NP 07/10/20 1102

## 2020-07-10 NOTE — Discharge Instructions (Addendum)
Take the ibuprofen, 600 mg every 6 hours with food, as needed for pain and inflammation.  Avoid applying ice directly on your skin so that you do not cause further injury.  Refrain from placing ice over top of your current area of frostbite.  Perform deep breathing exercises with 10 deep breaths every couple hours while awake to prevent pneumonia from forming.  If you have increasing redness, heat from the area of frostbite, drainage, or darkening of the skin you need to return for reevaluation or go to the emergency department.

## 2020-07-10 NOTE — ED Triage Notes (Addendum)
Pt c/o slip and fall about 3 weeks ago, reports rib pain on the back left side. Pt states pain increased with deep inhalation and certain movements. Pt also reports some chest congestion and cough. Pt also has red spot in the area of pain, reports he placed ice pack over this area yesterday, possibly skin irritation from extended cold exposure.

## 2020-11-27 ENCOUNTER — Ambulatory Visit: Payer: Self-pay | Admitting: Family Medicine

## 2021-08-08 ENCOUNTER — Ambulatory Visit
Admission: RE | Admit: 2021-08-08 | Discharge: 2021-08-08 | Disposition: A | Discharge status: Home / Self Care | Payer: BLUE CROSS/BLUE SHIELD | Source: Ambulatory Visit | Attending: Emergency Medicine | Admitting: Emergency Medicine

## 2021-08-08 DIAGNOSIS — K029 Dental caries, unspecified: Secondary | ICD-10-CM | POA: Diagnosis not present

## 2021-08-08 MED ORDER — AMOXICILLIN 875 MG PO TABS: 875.0000 mg | ORAL_TABLET | Freq: Two times a day (BID) | ORAL | 0 refills | Status: DC

## 2021-08-08 NOTE — ED Provider Notes (Signed)
?[facility] ? ? ? ?CSN: [id number] ?Arrival date & time: 08/08/21  1144 ? ? ?  ? ?History   ?Chief Complaint ?Chief Complaint  ?Patient presents with  ? Oral Swelling  ? Dental Pain  ? ? ?HPI ?Paul Sanders is a 36 y.o. male.  Patient presents with a left upper tooth pain x2 weeks.  The pain is getting worse and is causing pain in his left ear and throat.  He has history of dental decay and has a broken tooth in that area.  He contacted a dentist and has an appointment in the first week of May; the dentist instructed him to come to urgent care for an antibiotic until he can be seen.  Patient denies fever, chills, difficulty swallowing, shortness of breath, or other symptoms.  Treatment at home with ibuprofen.  Patient's medical history includes opioid abuse and he requests no narcotic treatment for pain. ? ?The history is provided by the patient and medical records.  ? ?Past Medical History:  ?Diagnosis Date  ? Opioid abuse ([facility])   ? ? ?There are no problems to display for this patient. ? ? ?Past Surgical History:  ?Procedure Laterality Date  ? NO PAST SURGERIES    ? ? ? ? ? ?Home Medications   ? ?Prior to Admission medications   ?Medication Sig Start Date End Date Taking? Authorizing Provider  ?amoxicillin (AMOXIL) 875 MG tablet Take 1 tablet (875 mg total) by mouth 2 (two) times daily. 08/08/21  Yes Mickie Bail, NP  ?buprenorphine-naloxone (SUBOXONE) 8-2 mg SUBL SL tablet Place 1 tablet under the tongue daily.    [provider]  ?ibuprofen (ADVIL) 600 MG tablet Take 1 tablet (600 mg total) by mouth every 6 (six) hours as needed. 07/10/20   Becky Augusta, NP  ? ? ?Family History ?History reviewed. No pertinent family history. ? ?Social History ?Social History  ? ?Tobacco Use  ? Smoking status: Former  ?  Types: E-cigarettes  ? Smokeless tobacco: Never  ?Vaping Use  ? Vaping Use: Every day  ? Substances: Nicotine  ?Substance Use Topics  ? Alcohol use: Never  ? Drug use: Never  ? ? ? ?Allergies    ?Patient has no known allergies. ? ? ?Review of Systems ?Review of Systems  ?Constitutional:  Negative for chills and fever.  ?HENT:  Positive for dental problem. Negative for ear pain and sore throat.   ?Respiratory:  Negative for cough and shortness of breath.   ?All other systems reviewed and are negative. ? ? ?Physical Exam ?Triage Vital Signs ?ED Triage Vitals  ?Enc Vitals Group  ?   BP   ?   Pulse   ?   Resp   ?   Temp   ?   Temp src   ?   SpO2   ?   Weight   ?   Height   ?   Head Circumference   ?   Peak Flow   ?   Pain Score   ?   Pain Loc   ?   Pain Edu?   ?   Excl. in [place]?   ? ?No data found. ? ?Updated Vital Signs ?There were no vitals taken for this visit. ? ?Visual Acuity ?Right Eye Distance:   ?Left Eye Distance:   ?Bilateral Distance:   ? ?Right Eye Near:   ?Left Eye Near:    ?Bilateral Near:    ? ?Physical Exam ?Vitals and nursing note reviewed.  ?  Constitutional:   ?   General: He is not in acute distress. ?   Appearance: Normal appearance. He is well-developed. He is not ill-appearing.  ?HENT:  ?   Mouth/Throat:  ?   Mouth: Mucous membranes are moist.  ?   Dentition: Abnormal dentition. Dental caries present.  ?   Pharynx: Oropharynx is clear.  ? ?   Comments: Speech clear.  No difficulty swallowing. ?Cardiovascular:  ?   Rate and Rhythm: Normal rate and regular rhythm.  ?   Heart sounds: Normal heart sounds.  ?Pulmonary:  ?   Effort: Pulmonary effort is normal. No respiratory distress.  ?   Breath sounds: Normal breath sounds.  ?Musculoskeletal:  ?   Cervical back: Neck supple.  ?Skin: ?   General: Skin is warm and dry.  ?Neurological:  ?   Mental Status: He is alert.  ?Psychiatric:     ?   Mood and Affect: Mood normal.     ?   Behavior: Behavior normal.  ? ? ? ?UC Treatments / Results  ?Labs ?(all labs ordered are listed, but only abnormal results are displayed) ?Labs Reviewed - No data to display ? ?EKG ? ? ?Radiology ?No results found. ? ?Procedures ?Procedures (including critical care  time) ? ?Medications Ordered in UC ?Medications - No data to display ? ?Initial Impression / Assessment and Plan / UC Course  ?I have reviewed the triage vital signs and the nursing notes. ? ?Pertinent labs & imaging results that were available during my care of the patient were reviewed by me and considered in my medical decision making (see chart for details). ? ?Dental pain due to dental caries.  Treating with amoxicillin.  Discussed Tylenol or ibuprofen as needed for discomfort.  Instructed patient to follow-up with the dentist as scheduled.  Dental resource guide provided.  ED precautions discussed.  Patient agrees to plan of care. ? ? ?Final Clinical Impressions(s) / UC Diagnoses  ? ?Final diagnoses:  ?Pain due to dental caries  ? ? ? ?Discharge Instructions   ? ?  ?Take the antibiotic as prescribed.   ? ?A dental resource guide is attached.  Please call to make an appointment with a dentist as soon as possible.   ? ?Go to the emergency department if you have acute worsening symptoms.   ? ? ? ? ? ? ?ED Prescriptions   ? ? Medication Sig Dispense Auth. Provider  ? amoxicillin (AMOXIL) 875 MG tablet Take 1 tablet (875 mg total) by mouth 2 (two) times daily. 20 tablet Mickie Bail, NP  ? ?  ? ?PDMP not reviewed this encounter. ?  ?Mickie Bail, NP ?08/08/21 1224 ? ?

## 2021-08-08 NOTE — ED Triage Notes (Signed)
Pt here with dental pain and swelling x 2 weeks. Advised by dentist to be seen at urgent care until he can be seen in office.  ?

## 2021-08-08 NOTE — Discharge Instructions (Addendum)
Take the antibiotic as prescribed.    A dental resource guide is attached.  Please call to make an appointment with a dentist as soon as possible.    Go to the emergency department if you have acute worsening symptoms.     

## 2021-11-01 ENCOUNTER — Ambulatory Visit (INDEPENDENT_AMBULATORY_CARE_PROVIDER_SITE_OTHER): Payer: BLUE CROSS/BLUE SHIELD | Admitting: Family Medicine

## 2021-11-01 ENCOUNTER — Encounter: Payer: Self-pay | Admitting: Family Medicine

## 2021-11-01 ENCOUNTER — Other Ambulatory Visit: Payer: Self-pay | Admitting: Family Medicine

## 2021-11-01 VITALS — BP 120/80 | HR 76 | Ht 70.0 in | Wt 212.0 lb

## 2021-11-01 DIAGNOSIS — F419 Anxiety disorder, unspecified: Secondary | ICD-10-CM | POA: Diagnosis not present

## 2021-11-01 DIAGNOSIS — G4700 Insomnia, unspecified: Secondary | ICD-10-CM | POA: Diagnosis not present

## 2021-11-01 DIAGNOSIS — K5900 Constipation, unspecified: Secondary | ICD-10-CM

## 2021-11-01 DIAGNOSIS — I499 Cardiac arrhythmia, unspecified: Secondary | ICD-10-CM | POA: Diagnosis not present

## 2021-11-01 MED ORDER — TRAZODONE HCL 50 MG PO TABS: 25.0000 mg | ORAL_TABLET | Freq: Every evening | ORAL | 0 refills | Status: DC | PRN

## 2021-11-01 MED ORDER — PAROXETINE HCL 10 MG PO TABS: 10.0000 mg | ORAL_TABLET | Freq: Every day | ORAL | 0 refills | Status: DC

## 2021-11-01 MED ORDER — BISACODYL 5 MG PO TBEC: 5.0000 mg | DELAYED_RELEASE_TABLET | Freq: Every day | ORAL | 0 refills | Status: AC | PRN

## 2021-11-01 MED ORDER — BISACODYL 5 MG PO TBEC: 5.0000 mg | DELAYED_RELEASE_TABLET | Freq: Every day | ORAL | 0 refills | Status: DC | PRN

## 2021-11-01 NOTE — Telephone Encounter (Signed)
[facility] didn't have bisacodyl (DULCOLAX) 5 MG EC tablet / pt stated he was advised by Dr. Ashley Royalty that if this pharmacy didn't have it to call to change the pharmacy / pt asked if the RX can be sent to [id number] - [place], [place] - [address] AT [facility] OF [address] [facility]

## 2021-11-01 NOTE — Assessment & Plan Note (Signed)
Chronic issue with intermittent symptomatology, has responded well to Dulcolax in the past.  Patient does state hydrated throughout the day, denies any emesis or nausea.  Abdominal pain when he has not had a bowel movement in a few days.  Examination reveals soft, mildly tender abdomen in the right upper and right lower quadrants without rebound or Murphy sign, hypoactive bowel sounds noted.  No hepatosplenomegaly appreciated.  Plan for lifestyle changes, as needed Dulcolax, and we will assess his response at follow-up.

## 2021-11-01 NOTE — Progress Notes (Signed)
Primary Care / Sports Medicine Office Visit  Patient Information:  Patient ID: Paul Sanders, male DOB: 05-08-1985 Age: 36 y.o. MRN: [id number]   Paul Sanders is a pleasant 36 y.o. male presenting with the following:  Chief Complaint  Patient presents with   Establish Care   Abdominal Pain    Lower, discomfort, pain with pressing, does have trouble with bowel movements.    Irregular Heart Beat    Went to ER and was kept longer for irregular heart beat. States that he be laying in bed and feels strong heart beat every now and again.     Vitals:   11/01/21 0910  BP: 120/80  Pulse: 76  SpO2: 98%   Vitals:   11/01/21 0910  Weight: 212 lb (96.2 kg)  Height: 5\' 10"  (1.778 m)   Body mass index is 30.42 kg/m.  No results found.   Independent interpretation of notes and tests performed by another provider:   None  Procedures performed:   None  Pertinent History, Exam, Impression, and Recommendations:   Problem List Items Addressed This Visit       Other   Irregular heart beat - Primary    Longstanding issue, greater than 1 year, with more noticeable/worsening symptoms over the past year.  No specific change in activity or exposure at that time.  Described as intermittent, at times associated with chest discomfort, occurs with physical activity and with rest, primarily at nighttime when laying in bed.   Physical examination reveals no JVD, no carotid bruits, positive S1 and S2 with regular rate and rhythm noted today, no additional heart sounds are appreciated, symmetric pulses in the extremities and no peripheral edema is noted.  Plan to order risk stratification labs, begin treatment on comorbid anxiety, and will place a referral to cardiology for further evaluation and management.      Relevant Orders   Ambulatory referral to Cardiology   Apo A1 + B + Ratio   CBC   Comprehensive metabolic panel   Lipid panel   TSH   Anxiety    Elevated GAD scores in  the setting of intermittent chest discomfort, palpitations.  Has had prior diagnosis of the same and was advised pharmacotherapy but chose to address this nonpharmacologically at the time.  As the symptoms are primarily at nighttime disrupting sleep, but do occur in specific situations (crowds, etc.), we discussed pharmacologic and nonpharmacologic treatment strategies.  Plan initiate Paxil 10 mg daily, as needed 25-50 mg trazodone, and maintain follow-up in 2 months.      Relevant Medications   traZODone (DESYREL) 50 MG tablet   PARoxetine (PAXIL) 10 MG tablet   Other Relevant Orders   TSH   Constipation    Chronic issue with intermittent symptomatology, has responded well to Dulcolax in the past.  Patient does state hydrated throughout the day, denies any emesis or nausea.  Abdominal pain when he has not had a bowel movement in a few days.  Examination reveals soft, mildly tender abdomen in the right upper and right lower quadrants without rebound or Murphy sign, hypoactive bowel sounds noted.  No hepatosplenomegaly appreciated.  Plan for lifestyle changes, as needed Dulcolax, and we will assess his response at follow-up.      Relevant Medications   bisacodyl (DULCOLAX) 5 MG EC tablet   Other Relevant Orders   CBC   Comprehensive metabolic panel   Insomnia    In the setting of irregular heartbeat and anxiety,  plan for trial of as needed 25-50 mg trazodone.      Relevant Medications   traZODone (DESYREL) 50 MG tablet     Orders & Medications Meds ordered this encounter  Medications   traZODone (DESYREL) 50 MG tablet    Sig: Take 0.5-1 tablets (25-50 mg total) by mouth at bedtime as needed for sleep.    Dispense:  30 tablet    Refill:  0   bisacodyl (DULCOLAX) 5 MG EC tablet    Sig: Take 1 tablet (5 mg total) by mouth daily as needed for moderate constipation.    Dispense:  30 tablet    Refill:  0   PARoxetine (PAXIL) 10 MG tablet    Sig: Take 1 tablet (10 mg total) by mouth  daily.    Dispense:  90 tablet    Refill:  0   Orders Placed This Encounter  Procedures   Apo A1 + B + Ratio   CBC   Comprehensive metabolic panel   Lipid panel   TSH   Ambulatory referral to Cardiology     Return in about 2 months (around 01/02/2022).     Jerrol Banana, MD   Primary Care Sports Medicine [facility]

## 2021-11-01 NOTE — Assessment & Plan Note (Signed)
In the setting of irregular heartbeat and anxiety, plan for trial of as needed 25-50 mg trazodone.

## 2021-11-01 NOTE — Telephone Encounter (Signed)
Patient request change pharmacy- Rx forwarded Requested Prescriptions  Pending Prescriptions Disp Refills  . bisacodyl (DULCOLAX) 5 MG EC tablet 30 tablet 0    Sig: Take 1 tablet (5 mg total) by mouth daily as needed for moderate constipation.     Gastroenterology:  Laxatives Passed - 11/01/2021  1:53 PM      Passed - Valid encounter within last 12 months    Recent Outpatient Visits          Today Irregular heart beat   [facility] Jerrol Banana, MD      Future Appointments            In 2 months Ashley Royalty, Ocie Bob, MD [facility], [facility]

## 2021-11-01 NOTE — Assessment & Plan Note (Signed)
Longstanding issue, greater than 1 year, with more noticeable/worsening symptoms over the past year.  No specific change in activity or exposure at that time.  Described as intermittent, at times associated with chest discomfort, occurs with physical activity and with rest, primarily at nighttime when laying in bed.   Physical examination reveals no JVD, no carotid bruits, positive S1 and S2 with regular rate and rhythm noted today, no additional heart sounds are appreciated, symmetric pulses in the extremities and no peripheral edema is noted.  Plan to order risk stratification labs, begin treatment on comorbid anxiety, and will place a referral to cardiology for further evaluation and management.

## 2021-11-01 NOTE — Assessment & Plan Note (Signed)
Elevated GAD scores in the setting of intermittent chest discomfort, palpitations.  Has had prior diagnosis of the same and was advised pharmacotherapy but chose to address this nonpharmacologically at the time.  As the symptoms are primarily at nighttime disrupting sleep, but do occur in specific situations (crowds, etc.), we discussed pharmacologic and nonpharmacologic treatment strategies.  Plan initiate Paxil 10 mg daily, as needed 25-50 mg trazodone, and maintain follow-up in 2 months.

## 2021-11-02 LAB — COMPREHENSIVE METABOLIC PANEL
ALT: 27 IU/L (ref 0–44)
AST: 23 IU/L (ref 0–40)
Albumin/Globulin Ratio: 2 (ref 1.2–2.2)
Albumin: 4.6 g/dL (ref 4.0–5.0)
Alkaline Phosphatase: 65 IU/L (ref 44–121)
BUN/Creatinine Ratio: 18 (ref 9–20)
BUN: 16 mg/dL (ref 6–20)
Bilirubin Total: 0.5 mg/dL (ref 0.0–1.2)
CO2: 25 mmol/L (ref 20–29)
Calcium: 9.6 mg/dL (ref 8.7–10.2)
Chloride: 102 mmol/L (ref 96–106)
Creatinine, Ser: 0.9 mg/dL (ref 0.76–1.27)
Globulin, Total: 2.3 g/dL (ref 1.5–4.5)
Glucose: 102 mg/dL — ABNORMAL HIGH (ref 70–99)
Potassium: 4.5 mmol/L (ref 3.5–5.2)
Sodium: 141 mmol/L (ref 134–144)
Total Protein: 6.9 g/dL (ref 6.0–8.5)
eGFR: 114 mL/min/{1.73_m2} (ref >59)

## 2021-11-02 LAB — APO A1 + B + RATIO
Apolipo. B/A-1 Ratio: 0.6 ratio (ref 0.0–0.7)
Apolipoprotein A-1: 135 mg/dL (ref 101–178)
Apolipoprotein B: 78 mg/dL (ref <90)

## 2021-11-02 LAB — CBC
Hematocrit: 41.3 % (ref 37.5–51.0)
Hemoglobin: 14.1 g/dL (ref 13.0–17.7)
MCH: 29.3 pg (ref 26.6–33.0)
MCHC: 34.1 g/dL (ref 31.5–35.7)
MCV: 86 fL (ref 79–97)
Platelets: 227 10*3/uL (ref 150–450)
RBC: 4.81 x10E6/uL (ref 4.14–5.80)
RDW: 12.6 % (ref 11.6–15.4)
WBC: 6.3 10*3/uL (ref 3.4–10.8)

## 2021-11-02 LAB — TSH: TSH: 2.3 u[IU]/mL (ref 0.450–4.500)

## 2021-11-02 LAB — LIPID PANEL
Chol/HDL Ratio: 3.6 ratio (ref 0.0–5.0)
Cholesterol, Total: 153 mg/dL (ref 100–199)
HDL: 42 mg/dL (ref >39)
LDL Chol Calc (NIH): 86 mg/dL (ref 0–99)
Triglycerides: 145 mg/dL (ref 0–149)
VLDL Cholesterol Cal: 25 mg/dL (ref 5–40)

## 2021-11-04 NOTE — Progress Notes (Signed)
Lab added with R73.09

## 2021-11-05 LAB — SPECIMEN STATUS REPORT

## 2021-11-05 LAB — HGB A1C W/O EAG: Hgb A1c MFr Bld: 5.6 % (ref 4.8–5.6)

## 2021-11-19 ENCOUNTER — Ambulatory Visit: Payer: Self-pay | Admitting: Family Medicine

## 2021-11-26 ENCOUNTER — Ambulatory Visit: Payer: Self-pay | Admitting: Family Medicine

## 2021-12-31 ENCOUNTER — Ambulatory Visit: Payer: BLUE CROSS/BLUE SHIELD | Admitting: Cardiology

## 2022-01-07 ENCOUNTER — Ambulatory Visit: Payer: BLUE CROSS/BLUE SHIELD | Admitting: Family Medicine

## 2022-02-25 ENCOUNTER — Ambulatory Visit: Payer: BLUE CROSS/BLUE SHIELD | Admitting: Cardiology

## 2022-02-28 ENCOUNTER — Encounter: Payer: Self-pay | Admitting: Family Medicine

## 2022-02-28 NOTE — Telephone Encounter (Signed)
Are you able to change the referral?

## 2022-03-08 NOTE — Telephone Encounter (Signed)
Can you follow up on this?

## 2022-03-08 NOTE — Telephone Encounter (Unsigned)
Copied from [place] [id number]. Topic: Referral - Request for Referral >> Mar 08, 2022  4:03 PM Tiffany B wrote: Reason for CRM:   Caller checking on the status if cardiology referral was faxed to [facility] cardiology in [place], Dr Mali Lee, (see 02/28/2022 my chart message). Patient has a scheduled appointment with specialist on 04/01/2022. Specialist informed caller the referral has to be faxed.

## 2022-03-11 ENCOUNTER — Ambulatory Visit (INDEPENDENT_AMBULATORY_CARE_PROVIDER_SITE_OTHER): Payer: BLUE CROSS/BLUE SHIELD | Admitting: Family Medicine

## 2022-03-11 ENCOUNTER — Encounter: Payer: Self-pay | Admitting: Family Medicine

## 2022-03-11 VITALS — BP 128/70 | HR 86 | Ht 70.0 in | Wt 208.0 lb

## 2022-03-11 DIAGNOSIS — G4700 Insomnia, unspecified: Secondary | ICD-10-CM

## 2022-03-11 DIAGNOSIS — I499 Cardiac arrhythmia, unspecified: Secondary | ICD-10-CM | POA: Diagnosis not present

## 2022-03-11 DIAGNOSIS — K5900 Constipation, unspecified: Secondary | ICD-10-CM | POA: Diagnosis not present

## 2022-03-11 DIAGNOSIS — F419 Anxiety disorder, unspecified: Secondary | ICD-10-CM | POA: Diagnosis not present

## 2022-03-11 DIAGNOSIS — J00 Acute nasopharyngitis [common cold]: Secondary | ICD-10-CM | POA: Insufficient documentation

## 2022-03-11 MED ORDER — AZITHROMYCIN 250 MG PO TABS: ORAL_TABLET | ORAL | 0 refills | Status: DC

## 2022-03-11 NOTE — Assessment & Plan Note (Signed)
Improved, did not require trazodone, has been making lifestyle changes with progress.  More symptoms while traveling on the road for work.  Knows to dose trazodone as needed.  We will continue to follow-up on this issue peripherally.

## 2022-03-11 NOTE — Assessment & Plan Note (Signed)
Significant improvement with nonpharmacologic interventions, patient opted to not initiate paroxetine.  Given his interval reported improved scores, this is reasonable.  Have encouraged continued nonpharmacologic management, we will coordinate follow-up in 2 months for reevaluation.

## 2022-03-11 NOTE — Progress Notes (Signed)
Primary Care / Sports Medicine Office Visit  Patient Information:  Patient ID: Paul Sanders, male DOB: 12-20-1985 Age: 36 y.o. MRN: [id number]   Paul Sanders is a pleasant 36 y.o. male presenting with the following:  Chief Complaint  Patient presents with   Ear Pain    Left intermittent pain. Dizziness. Having slight vertigo intermittently. Ear was hurting today.    bowels   Stress    Vitals:   03/11/22 1530  BP: 128/70  Pulse: 86  SpO2: 96%   Vitals:   03/11/22 1530  Weight: 208 lb (94.3 kg)  Height: 5\' 10"  (1.778 m)   Body mass index is 29.84 kg/m.  No results found.   Independent interpretation of notes and tests performed by another provider:   None  Procedures performed:   None  Pertinent History, Exam, Impression, and Recommendations:   Problem List Items Addressed This Visit       Respiratory   Acute rhinitis    Several day history of left ear fullness, congestion, mild shortness of air, did trial Mucinex briefly without significant response.  Examination reveals nontender sinuses, fluid posterior to left tympanic membrane, benign canal, contralateral benign, erythematous turbinates with mild swelling, benign oropharynx, left nontender shotty lymphadenopathy, lung fields are clear.  Findings represent rhinitis, supportive care advised, if symptoms persist he is to initiate antibiotics.        Other   Irregular heart beat    Recent risk stratification labs reassuring, does have upcoming visit with cardiology early December timeframe.  Cardiac exam reveals positive S1 and S2, regular rate and rhythm, no additional heart sounds, from pulmonary standpoint his lung fields are clear without bibasilar rales, no wheezes or rhonchi.  I encouraged patient to maintain follow-up with cardiology, we will follow-up on this issue in 2 months.      Anxiety    Significant improvement with nonpharmacologic interventions, patient opted to not initiate  paroxetine.  Given his interval reported improved scores, this is reasonable.  Have encouraged continued nonpharmacologic management, we will coordinate follow-up in 2 months for reevaluation.      Constipation - Primary    Chronic condition, ongoing symptomatology, worse while traveling/on the road.  Associated with abdominal pain, at times urgency, stool change.  Typically resolves when coming home which she attributes to more time for defecation and being relaxed.  He denies any blood in stool, painless bowel movements, some straining.  Abdominal examination with normoactive bowel sounds, diffuse mild tenderness without hepatosplenomegaly.  Added focus on adequate fiber intake, hydration, and increased physical activity.  Differential to include IBS, cannot exclude internal hemorrhoids.  Pending response to aggressive lifestyle changes, next steps can include medication based management.      Insomnia    Improved, did not require trazodone, has been making lifestyle changes with progress.  More symptoms while traveling on the road for work.  Knows to dose trazodone as needed.  We will continue to follow-up on this issue peripherally.        Orders & Medications Meds ordered this encounter  Medications   azithromycin (ZITHROMAX Z-PAK) 250 MG tablet    Sig: Take 2 tablets (500 mg) on  Day 1,  followed by 1 tablet (250 mg) once daily on Days 2 through 5.    Dispense:  6 tablet    Refill:  0   No orders of the defined types were placed in this encounter.    Return in about 2  months (around 05/11/2022).     Jerrol Banana, MD   Primary Care Sports Medicine [facility]

## 2022-03-11 NOTE — Patient Instructions (Addendum)
-   Review information provided regarding dietary and lifestyle changes - Use Flonase, 2 sprays in each nostril x 7 days - Use Claritin / Zyrtec / OTC antihistamine daily x 7 days - Use Mucinex twice daily x 7 days - If symptoms fail to improve by mid-week next week, start antibiotics - Maintain follow-up with cardiologist - Return in 2 months

## 2022-03-11 NOTE — Assessment & Plan Note (Signed)
Several day history of left ear fullness, congestion, mild shortness of air, did trial Mucinex briefly without significant response.  Examination reveals nontender sinuses, fluid posterior to left tympanic membrane, benign canal, contralateral benign, erythematous turbinates with mild swelling, benign oropharynx, left nontender shotty lymphadenopathy, lung fields are clear.  Findings represent rhinitis, supportive care advised, if symptoms persist he is to initiate antibiotics.

## 2022-03-11 NOTE — Assessment & Plan Note (Signed)
Recent risk stratification labs reassuring, does have upcoming visit with cardiology early December timeframe.  Cardiac exam reveals positive S1 and S2, regular rate and rhythm, no additional heart sounds, from pulmonary standpoint his lung fields are clear without bibasilar rales, no wheezes or rhonchi.  I encouraged patient to maintain follow-up with cardiology, we will follow-up on this issue in 2 months.

## 2022-03-11 NOTE — Assessment & Plan Note (Signed)
Chronic condition, ongoing symptomatology, worse while traveling/on the road.  Associated with abdominal pain, at times urgency, stool change.  Typically resolves when coming home which she attributes to more time for defecation and being relaxed.  He denies any blood in stool, painless bowel movements, some straining.  Abdominal examination with normoactive bowel sounds, diffuse mild tenderness without hepatosplenomegaly.  Added focus on adequate fiber intake, hydration, and increased physical activity.  Differential to include IBS, cannot exclude internal hemorrhoids.  Pending response to aggressive lifestyle changes, next steps can include medication based management.

## 2022-03-28 NOTE — Telephone Encounter (Signed)
Wife called to follow up on referral that needs to be faxed.  Spoke w/ Debbe Odea, she is going to re fax today.

## 2022-04-07 ENCOUNTER — Encounter: Payer: Self-pay | Admitting: Family Medicine

## 2022-04-11 NOTE — Telephone Encounter (Signed)
Please advise 

## 2022-05-19 ENCOUNTER — Ambulatory Visit: Payer: BLUE CROSS/BLUE SHIELD | Admitting: Family Medicine

## 2022-05-27 ENCOUNTER — Ambulatory Visit: Payer: BLUE CROSS/BLUE SHIELD | Admitting: Family Medicine

## 2022-11-13 IMAGING — CR DG RIBS W/ CHEST 3+V*L*
5 series · 5 of 5 positions shown · non-contrast
Comparison: Chest radiograph October 07, 2018

CLINICAL DATA: Pain following fall

EXAM:
LEFT RIBS AND CHEST - 3+ VIEW

[chest pa]
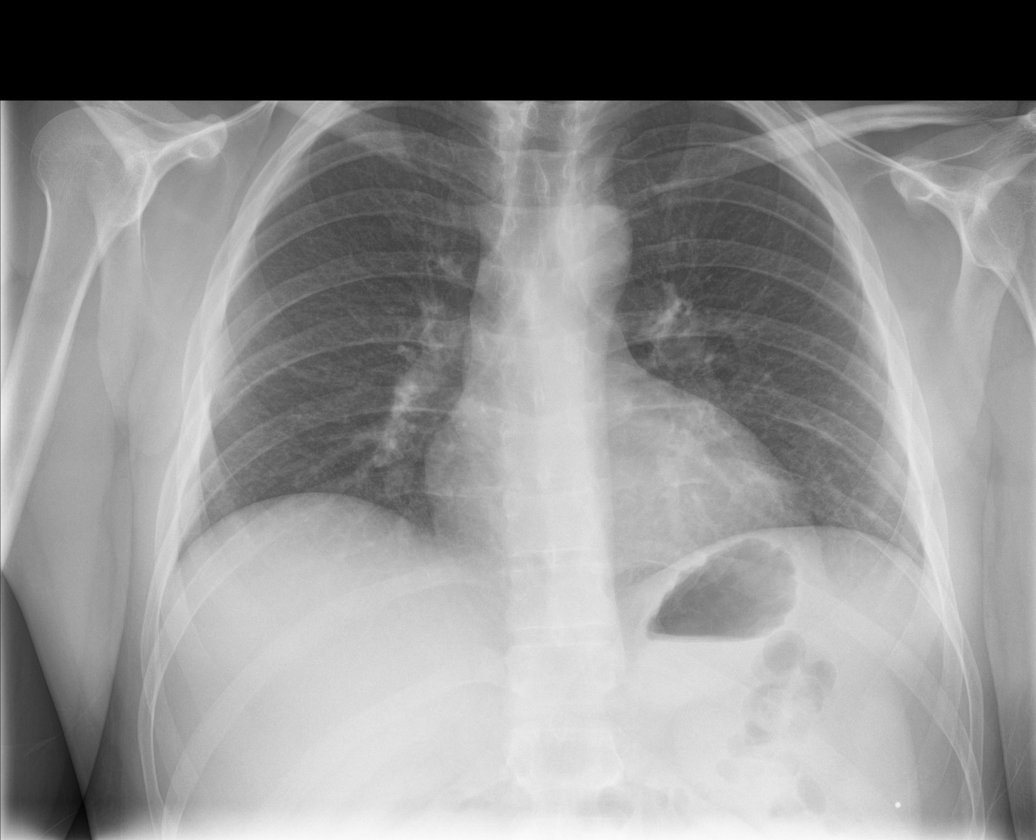

[rib pa (1 of 2)]
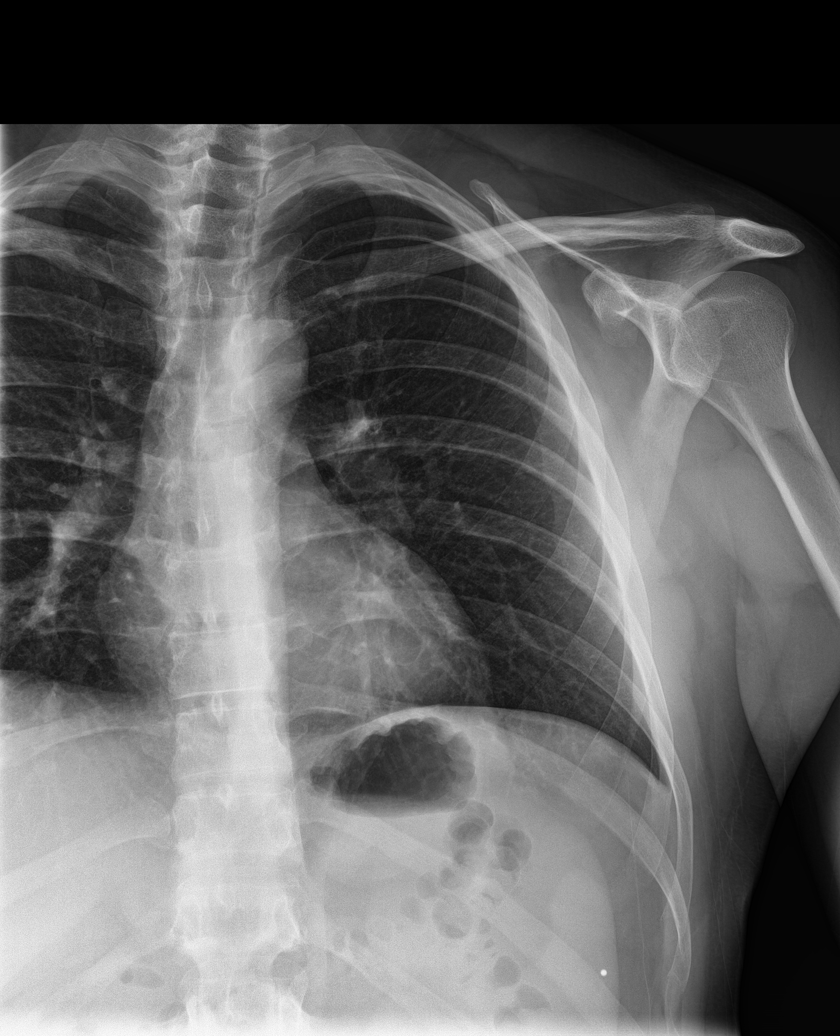

[rib pa (2 of 2)]
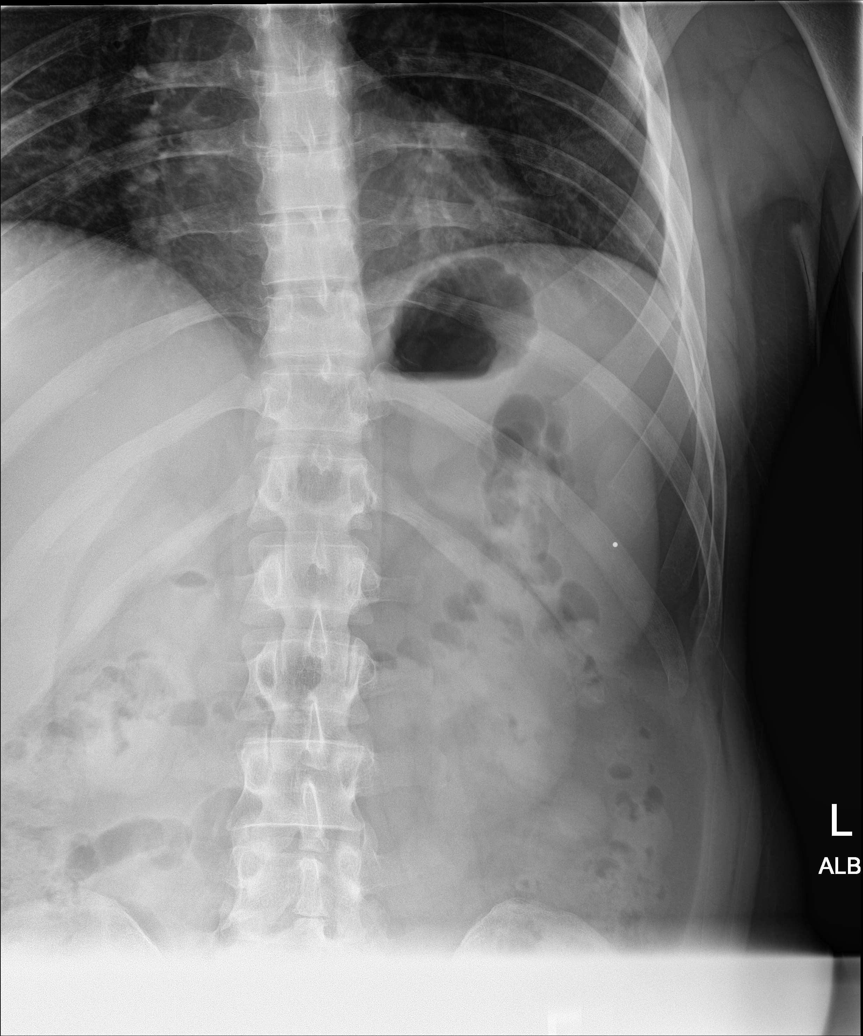

[rib obl (1 of 2)]
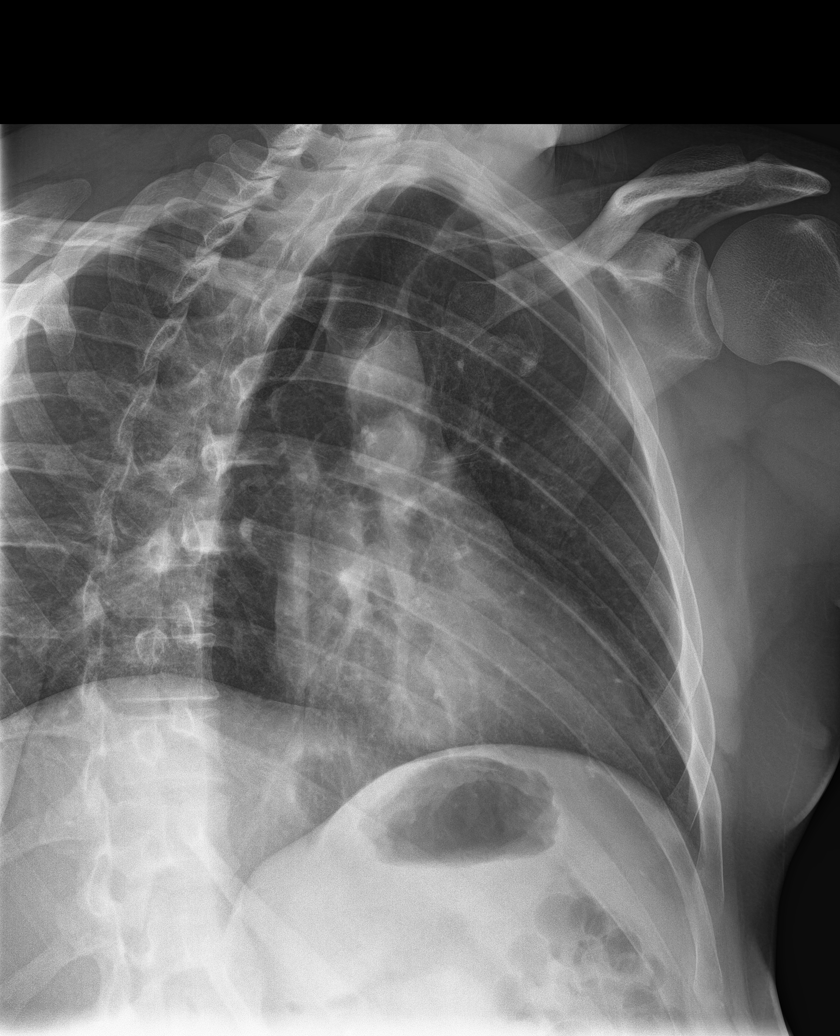

[rib obl (2 of 2)]
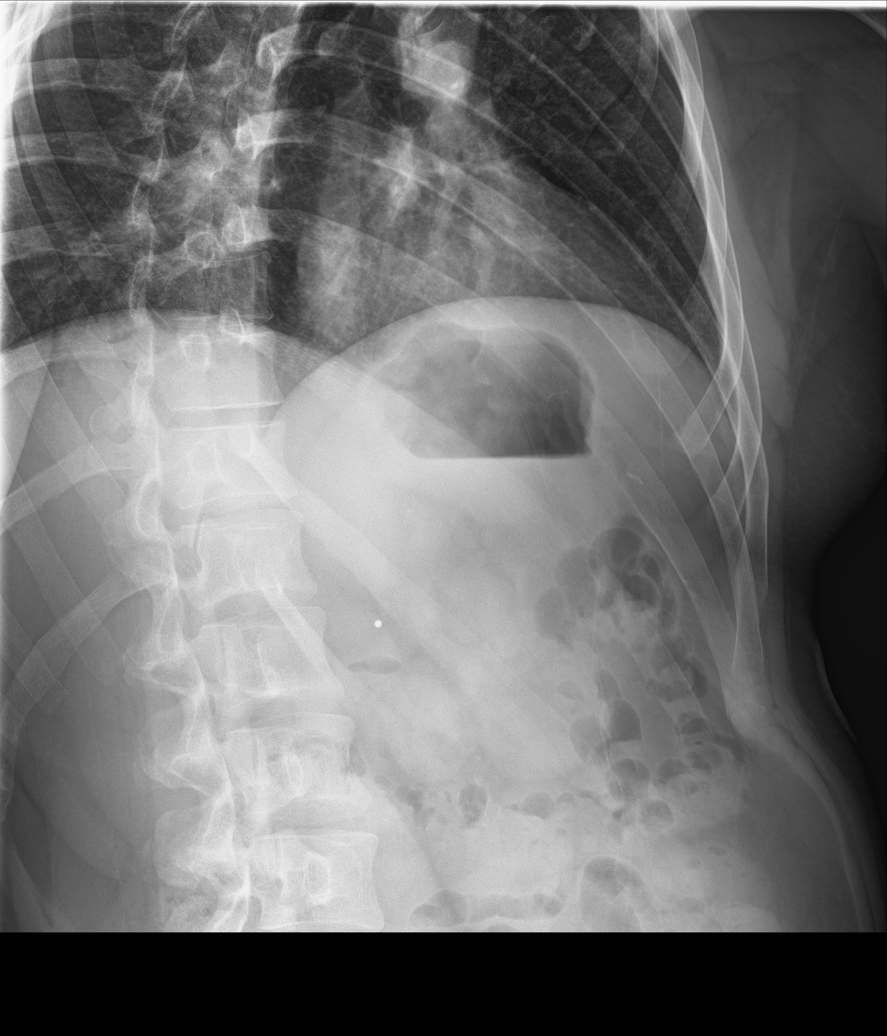

[5 of 5 positions shown; findings below may reference images not displayed]

FINDINGS: Frontal chest as well as oblique and cone-down rib images were
obtained. Lungs are clear. Heart size and pulmonary vascularity are
normal. No adenopathy. No evident pneumothorax or pleural effusion.
No appreciable rib fracture.
IMPRESSION: No evident rib fracture.  Lungs clear.

## 2023-07-06 DIAGNOSIS — G4733 Obstructive sleep apnea (adult) (pediatric): Secondary | ICD-10-CM | POA: Diagnosis not present

## 2023-10-13 ENCOUNTER — Encounter: Payer: Self-pay | Admitting: Family Medicine

## 2023-10-13 ENCOUNTER — Ambulatory Visit (INDEPENDENT_AMBULATORY_CARE_PROVIDER_SITE_OTHER): Admitting: Family Medicine

## 2023-10-13 VITALS — BP 124/82 | HR 60 | Ht 70.0 in | Wt 217.0 lb

## 2023-10-13 DIAGNOSIS — M542 Cervicalgia: Secondary | ICD-10-CM | POA: Diagnosis not present

## 2023-10-13 DIAGNOSIS — I499 Cardiac arrhythmia, unspecified: Secondary | ICD-10-CM

## 2023-10-13 DIAGNOSIS — K5900 Constipation, unspecified: Secondary | ICD-10-CM

## 2023-10-13 DIAGNOSIS — G4700 Insomnia, unspecified: Secondary | ICD-10-CM | POA: Diagnosis not present

## 2023-10-13 DIAGNOSIS — Z0000 Encounter for general adult medical examination without abnormal findings: Secondary | ICD-10-CM | POA: Insufficient documentation

## 2023-10-13 DIAGNOSIS — F419 Anxiety disorder, unspecified: Secondary | ICD-10-CM | POA: Diagnosis not present

## 2023-10-13 MED ORDER — METHOCARBAMOL 500 MG PO TABS
500.0000 mg | ORAL_TABLET | Freq: Three times a day (TID) | ORAL | 0 refills | Status: AC | PRN
Start: 2023-10-13 — End: ?

## 2023-10-13 NOTE — Assessment & Plan Note (Signed)
 He works in [employer], often in [employer], which impacts his hydration and electrolyte balance. Still overall improved.

## 2023-10-13 NOTE — Assessment & Plan Note (Signed)
 Improved

## 2023-10-13 NOTE — Assessment & Plan Note (Signed)
 Annual examination completed, risk stratification labs ordered, anticipatory guidance provided.  We will follow labs once resulted.

## 2023-10-13 NOTE — Assessment & Plan Note (Signed)
 Paul Sanders is a 38 year old male who presents with intermittent chest pain and stress-related symptoms. He was previously seen by Dr. Merlyn Starring with [facility] cardiology for chest pain.  He experiences intermittent chest pain, described as a strong, intense heartbeat felt in the chest. This sensation has occurred only once since his last cardiology evaluation over a year ago. A stress test at that time did not reproduce his symptoms, and he was informed that his heart was in good condition. The chest pain does not occur during physical activity but rather at rest, and he associates it with stress or anxiety. He has been making lifestyle changes, including dietary adjustments and stress management techniques, to improve his health.  Chest pain Intermittent chest pain at rest. Cardiac causes ruled out. Consider psychological, musculoskeletal, or esophageal origins.  Palpitations Intermittent palpitations likely due to stress or subconscious triggers. No cardiac pathology found.

## 2023-10-13 NOTE — Patient Instructions (Addendum)
-   Obtain fasting labs with orders provided (can have water or black coffee but otherwise no food or drink x 8 hours before labs) - Review information provided - Attend eye doctor annually, dentist every 6 months, work towards or maintain 30 minutes of moderate intensity physical activity at least 5 days per week, and consume a balanced diet - Return in 1 year for physical - Contact us  for any questions between now and then   Patient Action Plan  Anxiety: - Continue using non-medication strategies, such as breathing exercises, to manage anxiety. - Focus on stress management techniques, especially during work-related stress.  Cervicalgia (Neck Pain): - Follow the neck and shoulder strengthening exercises provided through MyChart. - Use the prescribed muscle relaxer as needed for severe symptoms, up to three times per day. Be aware that it may cause drowsiness. - Avoid positions that worsen your neck pain or headaches.  Constipation: - Ensure adequate hydration, especially when working in [employer], to maintain electrolyte balance and improve constipation.  Healthcare Maintenance: - Complete the ordered lab tests: CBC, comprehensive metabolic panel with GFR, hemoglobin A1c, hepatitis C antibody, lipid panel, and HIV antibody. - Await follow-up on lab results for further guidance.  Insomnia: - No specific actions needed as improvement noted.  Irregular Heartbeat and Chest Pain: - Continue lifestyle changes, including dietary adjustments and stress management techniques. - Monitor for any recurrence of chest pain or palpitations, and seek medical advice if symptoms worsen or become more frequent.  Red Flags: - If you experience new or worsening symptoms, such as prolonged chest pain, severe headaches, or significant changes in your health, contact your healthcare provider promptly.

## 2023-10-13 NOTE — Progress Notes (Signed)
 Annual Physical Exam Visit  Patient Information:  Patient ID: Paul Sanders, male DOB: 06/16/1985 Age: 38 y.o. MRN: [id number]   Subjective:   CC: Annual Physical Exam  HPI:  Paul Sanders is here for their annual physical.  I reviewed the past medical history, family history, social history, surgical history, and allergies today and changes were made as necessary.  Please see the problem list section below for additional details.  Past Medical History: Past Medical History:  Diagnosis Date   Opioid abuse (HCC)    Past Surgical History: Past Surgical History:  Procedure Laterality Date   NO PAST SURGERIES     Family History: History reviewed. No pertinent family history. Allergies: No Known Allergies Health Maintenance: Health Maintenance  Topic Date Due   HPV VACCINES (1 - Male 3-dose series) Never done   HIV Screening  Never done   Hepatitis C Screening  Never done   COVID-19 Vaccine (1 - 2024-25 season) Never done   INFLUENZA VACCINE  12/01/2023   DTaP/Tdap/Td (2 - Td or Tdap) 09/08/2027   Meningococcal B Vaccine  Aged Out    HM Colonoscopy   This patient has no relevant Health Maintenance data.    Medications: Current Outpatient Medications on File Prior to Visit  Medication Sig Dispense Refill   aspirin EC 81 MG tablet Take 81 mg by mouth daily. Swallow whole.     bisacodyl  (DULCOLAX) 5 MG EC tablet Take 1 tablet (5 mg total) by mouth daily as needed for moderate constipation. 30 tablet 0   buprenorphine-naloxone (SUBOXONE) 8-2 mg SUBL SL tablet Place 1 tablet under the tongue daily.     ibuprofen  (ADVIL ) 200 MG tablet Take 200 mg by mouth every 6 (six) hours as needed.     KRISTALOSE 20 g packet Take 20 g by mouth daily.     azithromycin  (ZITHROMAX  Z-PAK) 250 MG tablet Take 2 tablets (500 mg) on  Day 1,  followed by 1 tablet (250 mg) once daily on Days 2 through 5. 6 tablet 0   No current facility-administered medications on file prior to visit.     Objective:   Vitals:   10/13/23 1028  BP: 124/82  Pulse: 60  SpO2: 98%   Vitals:   10/13/23 1028  Weight: 217 lb (98.4 kg)  Height: 5' 10 (1.778 m)   Body mass index is 31.14 kg/m.  General: Well Developed, well nourished, and in no acute distress.  Neuro: Alert and oriented x3, extra-ocular muscles intact, sensation grossly intact. Cranial nerves II through XII are grossly intact, motor, sensory, and coordinative functions are intact. HEENT: Normocephalic, atraumatic, neck supple, no masses, no lymphadenopathy, thyroid nonenlarged. Oropharynx, nasopharynx, external ear canals are unremarkable. Skin: Warm and dry, no rashes noted.  Cardiac: Regular rate and rhythm, no murmurs rubs or gallops. No peripheral edema. Pulses symmetric. Respiratory: Clear to auscultation bilaterally. Speaking in full sentences.  Abdominal: Soft, minimally diffusely tender, nondistended, positive bowel sounds, no masses, no organomegaly. Musculoskeletal: Stable, and with full range of motion.  Impression and Recommendations:   The patient was counselled, risk factors were discussed, and anticipatory guidance given.  Problem List Items Addressed This Visit     Anxiety   He experiences stress-related symptoms, including palpitations and a sensation of his heart beating strongly. These episodes occur infrequently, often when he is at rest or feeling overwhelmed. He practices breathing exercises to manage these symptoms and has not used prescribed medications for anxiety. He feels  stressed a few days a week, often related to work concerns, but finds that focusing on his breathing helps alleviate these feelings.  Anxiety Anxiety managed without medication. Low GAD score. Emphasized non-medication strategies. - Encourage non-medication strategies for anxiety management.      Cervicalgia   He experiences headaches and neck pain, which he attributes to possibly pinching a nerve. The pain is described  as a 'rubber band pull back and snap' sensation, particularly when raising his arms or turning his head. He experiences no whole arm numbness in his arms.  Cervicalgia Neck pain with headaches and right arm numbness likely due to ulnar nerve compression. Musculoskeletal origin suspected. - Provide exercises for neck and shoulder strengthening through MyChart. - Prescribe muscle relaxer as needed for severe symptoms, take up to 3 times per day, can cause drowsiness. - Advise on avoiding exacerbating positions.      Relevant Medications   methocarbamol (ROBAXIN) 500 MG tablet   Constipation   He works in heating and air conditioning, often in high-temperature environments, which impacts his hydration and electrolyte balance. Still overall improved.      Healthcare maintenance - Primary   Annual examination completed, risk stratification labs ordered, anticipatory guidance provided.  We will follow labs once resulted.      Relevant Orders   CBC   Comprehensive metabolic panel with GFR   Hemoglobin A1c   Hepatitis C antibody   Lipid panel   HIV Antibody (routine testing w rflx)   Insomnia   Improved.      Irregular heart beat   Paul Sanders is a 38 year old male who presents with intermittent chest pain and stress-related symptoms. He was previously seen by Dr. Merlyn Starring with [facility] cardiology for chest pain.  He experiences intermittent chest pain, described as a strong, intense heartbeat felt in the chest. This sensation has occurred only once since his last cardiology evaluation over a year ago. A stress test at that time did not reproduce his symptoms, and he was informed that his heart was in good condition. The chest pain does not occur during physical activity but rather at rest, and he associates it with stress or anxiety. He has been making lifestyle changes, including dietary adjustments and stress management techniques, to improve his health.  Chest pain Intermittent chest pain  at rest. Cardiac causes ruled out. Consider psychological, musculoskeletal, or esophageal origins.  Palpitations Intermittent palpitations likely due to stress or subconscious triggers. No cardiac pathology found.       I provided a total time of 40 minutes including both face-to-face and non-face-to-face time on 10/13/2023 inclusive of time utilized for medical chart review, information gathering, care coordination with staff, and documentation completion.   Orders & Medications Medications:  Meds ordered this encounter  Medications   methocarbamol (ROBAXIN) 500 MG tablet    Sig: Take 1 tablet (500 mg total) by mouth every 8 (eight) hours as needed for muscle spasms.    Dispense:  30 tablet    Refill:  0   Orders Placed This Encounter  Procedures   CBC   Comprehensive metabolic panel with GFR   Hemoglobin A1c   Hepatitis C antibody   Lipid panel   HIV Antibody (routine testing w rflx)     Return in about 1 year (around 10/12/2024) for CPE.    Ma Saupe, MD, [facility]   Primary Care Sports Medicine Primary Care and Sports Medicine at [facility]

## 2023-10-13 NOTE — Assessment & Plan Note (Signed)
 He experiences stress-related symptoms, including palpitations and a sensation of his heart beating strongly. These episodes occur infrequently, often when he is at rest or feeling overwhelmed. He practices breathing exercises to manage these symptoms and has not used prescribed medications for anxiety. He feels stressed a few days a week, often related to work concerns, but finds that focusing on his breathing helps alleviate these feelings.  Anxiety Anxiety managed without medication. Low GAD score. Emphasized non-medication strategies. - Encourage non-medication strategies for anxiety management.

## 2023-10-13 NOTE — Assessment & Plan Note (Signed)
 He experiences headaches and neck pain, which he attributes to possibly pinching a nerve. The pain is described as a 'rubber band pull back and snap' sensation, particularly when raising his arms or turning his head. He experiences no whole arm numbness in his arms.  Cervicalgia Neck pain with headaches and right arm numbness likely due to ulnar nerve compression. Musculoskeletal origin suspected. - Provide exercises for neck and shoulder strengthening through MyChart. - Prescribe muscle relaxer as needed for severe symptoms, take up to 3 times per day, can cause drowsiness. - Advise on avoiding exacerbating positions.

## 2023-10-14 LAB — LIPID PANEL
Chol/HDL Ratio: 3 ratio (ref 0.0–5.0)
Cholesterol, Total: 156 mg/dL (ref 100–199)
HDL: 52 mg/dL (ref >39)
LDL Chol Calc (NIH): 86 mg/dL (ref 0–99)
Triglycerides: 99 mg/dL (ref 0–149)
VLDL Cholesterol Cal: 18 mg/dL (ref 5–40)

## 2023-10-14 LAB — COMPREHENSIVE METABOLIC PANEL WITH GFR
ALT: 28 IU/L (ref 0–44)
AST: 19 IU/L (ref 0–40)
Albumin: 4.9 g/dL (ref 4.1–5.1)
Alkaline Phosphatase: 62 IU/L (ref 44–121)
BUN/Creatinine Ratio: 13 (ref 9–20)
BUN: 12 mg/dL (ref 6–20)
Bilirubin Total: 0.5 mg/dL (ref 0.0–1.2)
CO2: 24 mmol/L (ref 20–29)
Calcium: 10 mg/dL (ref 8.7–10.2)
Chloride: 99 mmol/L (ref 96–106)
Creatinine, Ser: 0.89 mg/dL (ref 0.76–1.27)
Globulin, Total: 2.4 g/dL (ref 1.5–4.5)
Glucose: 87 mg/dL (ref 70–99)
Potassium: 4.8 mmol/L (ref 3.5–5.2)
Sodium: 141 mmol/L (ref 134–144)
Total Protein: 7.3 g/dL (ref 6.0–8.5)
eGFR: 113 mL/min/{1.73_m2} (ref >59)

## 2023-10-14 LAB — HEMOGLOBIN A1C
Est. average glucose Bld gHb Est-mCnc: 108 mg/dL
Hgb A1c MFr Bld: 5.4 % (ref 4.8–5.6)

## 2023-10-14 LAB — HEPATITIS C ANTIBODY: Hep C Virus Ab: NONREACTIVE

## 2023-10-14 LAB — CBC
Hematocrit: 43.8 % (ref 37.5–51.0)
Hemoglobin: 14.7 g/dL (ref 13.0–17.7)
MCH: 29.5 pg (ref 26.6–33.0)
MCHC: 33.6 g/dL (ref 31.5–35.7)
MCV: 88 fL (ref 79–97)
Platelets: 251 10*3/uL (ref 150–450)
RBC: 4.99 x10E6/uL (ref 4.14–5.80)
RDW: 12.9 % (ref 11.6–15.4)
WBC: 7.3 10*3/uL (ref 3.4–10.8)

## 2023-10-14 LAB — HIV ANTIBODY (ROUTINE TESTING W REFLEX): HIV Screen 4th Generation wRfx: NONREACTIVE

## 2023-10-17 ENCOUNTER — Ambulatory Visit: Payer: Self-pay | Admitting: Family Medicine

## 2023-10-21 DIAGNOSIS — G4733 Obstructive sleep apnea (adult) (pediatric): Secondary | ICD-10-CM | POA: Diagnosis not present

## 2023-10-25 ENCOUNTER — Encounter: Admitting: Family Medicine

## 2023-10-30 ENCOUNTER — Ambulatory Visit: Admitting: Family Medicine

## 2023-12-12 DIAGNOSIS — G4733 Obstructive sleep apnea (adult) (pediatric): Secondary | ICD-10-CM | POA: Diagnosis not present

## 2023-12-21 ENCOUNTER — Ambulatory Visit (INDEPENDENT_AMBULATORY_CARE_PROVIDER_SITE_OTHER): Admitting: Family Medicine

## 2023-12-21 ENCOUNTER — Telehealth: Payer: Self-pay

## 2023-12-21 ENCOUNTER — Other Ambulatory Visit (HOSPITAL_COMMUNITY): Payer: Self-pay

## 2023-12-21 ENCOUNTER — Encounter: Payer: Self-pay | Admitting: Family Medicine

## 2023-12-21 VITALS — BP 110/72 | HR 77 | Temp 98.1°F | Ht 70.0 in | Wt 211.0 lb

## 2023-12-21 DIAGNOSIS — J0140 Acute pansinusitis, unspecified: Secondary | ICD-10-CM | POA: Diagnosis not present

## 2023-12-21 MED ORDER — AZITHROMYCIN 250 MG PO TABS: ORAL_TABLET | ORAL | 0 refills | Status: AC

## 2023-12-21 MED ORDER — PROMETHAZINE-DM 6.25-15 MG/5ML PO SYRP: 5.0000 mL | ORAL_SOLUTION | Freq: Four times a day (QID) | ORAL | 0 refills | Status: AC | PRN

## 2023-12-21 MED ORDER — METHYLPREDNISOLONE 4 MG PO TBPK: ORAL_TABLET | ORAL | 0 refills | Status: AC

## 2023-12-21 NOTE — Progress Notes (Signed)
 Primary Care / Sports Medicine Office Visit  Patient Information:  Patient ID: Paul Sanders, male DOB: 02/26/1986 Age: 38 y.o. MRN: [id number]   Paul Sanders is a pleasant 38 y.o. male presenting with the following:  Chief Complaint  Patient presents with   Nasal Congestion    Nasal and chest congestion x 1 week. Patient started having chest congestion on 12/15/23. Patient has tried OTC medications for congestion but his symptoms have got worse and not getting better.     Vitals:   12/21/23 1017  BP: 110/72  Pulse: 77  Temp: 98.1 F (36.7 C)  SpO2: 98%   Vitals:   12/21/23 1017  Weight: 211 lb (95.7 kg)  Height: 5' 10 (1.778 m)   Body mass index is 30.28 kg/m.  No results found.   Independent interpretation of notes and tests performed by another provider:   None  Procedures performed:   None  Pertinent History, Exam, Impression, and Recommendations:   Problem List Items Addressed This Visit     Acute pansinusitis - Primary   History of Present Illness Paul Sanders is a 38 year old male who presents with sinus and chest congestion.  Upper respiratory symptoms - Sinus congestion began Monday evening or Tuesday, causing difficulty breathing through the nose - Initial burning sensation in the sinus last Wednesday or Thursday, resolved after about five minutes - Brief earache occurred, resolved spontaneously - Nasal spray (possibly Afrin) provided some relief, but symptoms have persisted for over a week  Lower respiratory symptoms - Chest congestion developed by Thursday, intensified on Friday, and became severe by Saturday - Fever accompanied chest congestion - Cough developed after initial onset of chest congestion - Rib discomfort present - Symptoms are reminiscent of prior episodes of walking pneumonia  Systemic symptoms - Fatigue and feeling worn out since onset of illness - Fever present with chest congestion  Impact on daily  functioning - Unable to work today due to symptoms; works in [employer], a physically demanding job - Concerned about appropriate timing for return to work  Medication use and considerations - Alternating between Mucinex and Advil  Sinus Congestion for symptom relief - Cautious with medication use due to history of Suboxone use and current efforts to wean off  Sleep disturbance - Uses CPAP machine for sleep apnea, which has caused discomfort due to sinus congestion  Physical Exam VITALS: SaO2- 98% HEENT: Bilateral tympanic membranes and canals are benign. Oral pharynx exhibits mild erythema and swelling without exudates. Nasal pharynx is significantly erythematous and swollen. Bilateral tenderness is present at the frontal and maxillary sinus regions symmetrically. NECK: No lymphadenopathy. CHEST: Lungs are clear to auscultation bilaterally with no wheezes, rales, or rhonchi, and breath sounds are equal and symmetric bilaterally. Discomfort is present at the anterior lower costal margins with deep inspiration. CARDIOVASCULAR: Cardiac sounds are benign.  Results LABS Oxygen saturation: 98% (10/2023)  Assessment and Plan Acute bacterial sinusitis with upper respiratory infection and chest congestion Bacterial sinusitis suspected due to symptom duration and worsening. Discussed antibiotics and symptomatic treatment. Risks and benefits of antibiotics explained. Agreed on treatment plan. - Prescribe azithromycin  (Z-Pak). - Prescribe prescription-grade cough syrup for nighttime relief and to dose as needed. - Prescribe Medrol  Dosepak for anti-inflammatory effects. - Recommend over-the-counter intranasal steroid spray. - Advise use of Mucinex. - Encourage hydration.  Obstructive sleep apnea on CPAP Addressed concerns about CPAP use with sinus congestion. Advised continuation. Discussed equipment hygiene. - Continue using CPAP  machine as tolerated. - Ensure regular cleaning and  maintenance of CPAP equipment.  Work limitations due to illness Discussed work limitations and need for rest. - Provide work note for absence until Monday. - Advise follow-up if symptoms do not improve or worsen.      Relevant Medications   azithromycin  (ZITHROMAX ) 250 MG tablet   promethazine -dextromethorphan (PROMETHAZINE -DM) 6.25-15 MG/5ML syrup   methylPREDNISolone  (MEDROL  DOSEPAK) 4 MG TBPK tablet     Orders & Medications Medications:  Meds ordered this encounter  Medications   azithromycin  (ZITHROMAX ) 250 MG tablet    Sig: Take 2 tablets on day 1, then 1 tablet daily on days 2 through 5    Dispense:  6 tablet    Refill:  0   promethazine -dextromethorphan (PROMETHAZINE -DM) 6.25-15 MG/5ML syrup    Sig: Take 5 mLs by mouth 4 (four) times daily as needed for cough.    Dispense:  118 mL    Refill:  0   methylPREDNISolone  (MEDROL  DOSEPAK) 4 MG TBPK tablet    Sig: Use as directed.    Dispense:  21 each    Refill:  0   No orders of the defined types were placed in this encounter.    Return if symptoms worsen or fail to improve.     Selinda JINNY Ku, MD, [facility]   Primary Care Sports Medicine Primary Care and Sports Medicine at [facility]

## 2023-12-21 NOTE — Patient Instructions (Signed)
 Patient Plan for Post-Visit Guidance  Sinus and Chest Congestion - Take azithromycin  (Z-Pak) as prescribed. - Use prescription cough syrup at night or as needed for cough relief. - Take Medrol  Dosepak as directed for inflammation. - Use an over-the-counter intranasal steroid spray daily. - Continue using Mucinex for congestion. - Drink plenty of fluids to stay hydrated.  CPAP Use for Sleep Apnea - Continue using your CPAP machine as tolerated. - Clean and maintain CPAP equipment regularly.  Work and Activity - Rest at home and avoid work until Monday as noted. - Resume work when symptoms improve and you feel able.  Red Flags - If you develop shortness of breath, chest pain, high fever, confusion, severe headache, neck stiffness, or worsening symptoms, seek medical attention promptly.  Follow-Up - If symptoms do not improve or worsen, schedule a follow-up appointment.

## 2023-12-21 NOTE — Assessment & Plan Note (Signed)
 History of Present Illness Paul Sanders is a 38 year old male who presents with sinus and chest congestion.  Upper respiratory symptoms - Sinus congestion began Monday evening or Tuesday, causing difficulty breathing through the nose - Initial burning sensation in the sinus last Wednesday or Thursday, resolved after about five minutes - Brief earache occurred, resolved spontaneously - Nasal spray (possibly Afrin) provided some relief, but symptoms have persisted for over a week  Lower respiratory symptoms - Chest congestion developed by Thursday, intensified on Friday, and became severe by Saturday - Fever accompanied chest congestion - Cough developed after initial onset of chest congestion - Rib discomfort present - Symptoms are reminiscent of prior episodes of walking pneumonia  Systemic symptoms - Fatigue and feeling worn out since onset of illness - Fever present with chest congestion  Impact on daily functioning - Unable to work today due to symptoms; works in [employer], a physically demanding job - Concerned about appropriate timing for return to work  Medication use and considerations - Alternating between Mucinex and Advil  Sinus Congestion for symptom relief - Cautious with medication use due to history of Suboxone use and current efforts to wean off  Sleep disturbance - Uses CPAP machine for sleep apnea, which has caused discomfort due to sinus congestion  Physical Exam VITALS: SaO2- 98% HEENT: Bilateral tympanic membranes and canals are benign. Oral pharynx exhibits mild erythema and swelling without exudates. Nasal pharynx is significantly erythematous and swollen. Bilateral tenderness is present at the frontal and maxillary sinus regions symmetrically. NECK: No lymphadenopathy. CHEST: Lungs are clear to auscultation bilaterally with no wheezes, rales, or rhonchi, and breath sounds are equal and symmetric bilaterally. Discomfort is present at the anterior  lower costal margins with deep inspiration. CARDIOVASCULAR: Cardiac sounds are benign.  Results LABS Oxygen saturation: 98% (10/2023)  Assessment and Plan Acute bacterial sinusitis with upper respiratory infection and chest congestion Bacterial sinusitis suspected due to symptom duration and worsening. Discussed antibiotics and symptomatic treatment. Risks and benefits of antibiotics explained. Agreed on treatment plan. - Prescribe azithromycin  (Z-Pak). - Prescribe prescription-grade cough syrup for nighttime relief and to dose as needed. - Prescribe Medrol  Dosepak for anti-inflammatory effects. - Recommend over-the-counter intranasal steroid spray. - Advise use of Mucinex. - Encourage hydration.  Obstructive sleep apnea on CPAP Addressed concerns about CPAP use with sinus congestion. Advised continuation. Discussed equipment hygiene. - Continue using CPAP machine as tolerated. - Ensure regular cleaning and maintenance of CPAP equipment.  Work limitations due to illness Discussed work limitations and need for rest. - Provide work note for absence until Monday. - Advise follow-up if symptoms do not improve or worsen.

## 2023-12-21 NOTE — Telephone Encounter (Signed)
 Pharmacy Patient Advocate Encounter   Received notification from Patient Pharmacy that prior authorization for Promethazine  DM syrup is required/requested.   Insurance verification completed.   The patient is insured through [employer] AD[employer] .   Per test claim:  Product/Service not covered - Plan/Benefit Exclusion

## 2023-12-26 ENCOUNTER — Telehealth: Payer: Self-pay | Admitting: Pharmacy Technician

## 2023-12-26 ENCOUNTER — Other Ambulatory Visit: Payer: Self-pay | Admitting: Family Medicine

## 2023-12-26 ENCOUNTER — Other Ambulatory Visit (HOSPITAL_COMMUNITY): Payer: Self-pay

## 2023-12-26 MED ORDER — BENZONATATE 200 MG PO CAPS
200.0000 mg | ORAL_CAPSULE | Freq: Three times a day (TID) | ORAL | 0 refills | Status: AC | PRN
Start: 2023-12-26 — End: ?

## 2023-12-26 NOTE — Telephone Encounter (Signed)
 Please review and send in different cough syrup.  JM

## 2023-12-26 NOTE — Telephone Encounter (Signed)
 Pharmacy Patient Advocate Encounter   Received notification from Onbase that prior authorization for Promethazine -DM SYRUP is required/requested.   Insurance verification completed.   The patient is insured through [facility] .   Per test claim: PLAN/BENEFIT EXCLUSION

## 2024-01-02 ENCOUNTER — Other Ambulatory Visit (HOSPITAL_COMMUNITY): Payer: Self-pay

## 2024-01-03 ENCOUNTER — Other Ambulatory Visit (HOSPITAL_COMMUNITY): Payer: Self-pay

## 2024-01-29 DIAGNOSIS — G4733 Obstructive sleep apnea (adult) (pediatric): Secondary | ICD-10-CM | POA: Diagnosis not present

## 2024-02-01 ENCOUNTER — Ambulatory Visit: Admitting: Family Medicine

## 2024-03-26 DIAGNOSIS — G4733 Obstructive sleep apnea (adult) (pediatric): Secondary | ICD-10-CM

## 2024-04-02 ENCOUNTER — Encounter: Payer: Self-pay | Admitting: Sleep Medicine

## 2024-04-02 ENCOUNTER — Ambulatory Visit: Admitting: Sleep Medicine

## 2024-04-02 VITALS — BP 130/80 | HR 84 | Temp 97.9°F | Ht 70.0 in | Wt 222.2 lb

## 2024-04-02 DIAGNOSIS — G4733 Obstructive sleep apnea (adult) (pediatric): Secondary | ICD-10-CM

## 2024-04-02 DIAGNOSIS — F5112 Insufficient sleep syndrome: Secondary | ICD-10-CM

## 2024-04-02 NOTE — Patient Instructions (Signed)

## 2024-04-02 NOTE — Progress Notes (Signed)
 Name:Paul Sanders MRN: [id number] DOB: 22-Jul-1985   CHIEF COMPLAINT:  ESTABLISH CARE FOR OSA   HISTORY OF PRESENT ILLNESS: Mr. Lyssy is a 38 y.o. w/ a h/o OSA, h/o opioid addiction and obesity who presents to establish care for OSA. Reports that he was initially diagnosed with OSA a few years ago and was subsequently started on CPAP therapy. Reports using CPAP therapy every night, which is confirmed by compliance data. He is currently using the Airfit N20 mask, which is uncomfortable. Reports feeling a little more refreshed upon awakening with CPAP therapy.   Reports nocturnal awakenings due to unclear reasons, however does not have difficulty falling back to sleep. Reports significant weight changes. Admits to dry mouth and occasional night sweats. Denies morning headaches, RLS symptoms, dream enactment, cataplexy, hypnagogic or hypnapompic hallucinations. Reports a family history of sleep apnea. Reports occasional drowsy driving. Drinks 1 cup of coffee, 1 energy drink and 1 soda daily, denies alcohol or illicit drug use. Uses nicotine pouches daily.   Bedtime 9-11 pm Sleep onset 5 mins Rise time 4:45-7 am    EPWORTH SLEEP SCORE 9    04/02/2024    9:00 AM  Results of the Epworth flowsheet  Sitting and reading 1  Watching TV 1  Sitting, inactive in a public place (e.g. a theatre or a meeting) 1  As a passenger in a car for an hour without a break 1  Lying down to rest in the afternoon when circumstances permit 3  Sitting and talking to someone 1  Sitting quietly after a lunch without alcohol 1  In a car, while stopped for a few minutes in traffic 0  Total score 9    PAST MEDICAL HISTORY :   has a past medical history of Arrhythmia, Heart murmur, and Opioid abuse (HCC).  has a past surgical history that includes No past surgeries. Prior to Admission medications   Medication Sig Start Date End Date Taking? Authorizing Provider  aspirin EC 81 MG tablet Take 81 mg by  mouth daily. Swallow whole.   Yes [provider]  b complex vitamins capsule Take 1 capsule by mouth daily.   Yes [provider]  buprenorphine-naloxone (SUBOXONE) 8-2 mg SUBL SL tablet Place 1 tablet under the tongue daily.   Yes [provider]  ibuprofen  (ADVIL ) 200 MG tablet Take 200 mg by mouth every 6 (six) hours as needed.   Yes [provider]  KRISTALOSE 20 g packet Take 20 g by mouth daily. 10/03/23  Yes [provider]  Menaquinone-7 (K2-45) 45 MCG CAPS Take 45 mcg by mouth daily.   Yes [provider]  methocarbamol  (ROBAXIN ) 500 MG tablet Take 1 tablet (500 mg total) by mouth every 8 (eight) hours as needed for muscle spasms. 10/13/23  Yes Alvia Selinda PARAS, MD  methylPREDNISolone  (MEDROL  DOSEPAK) 4 MG TBPK tablet Use as directed. 12/21/23  Yes Alvia Selinda PARAS, MD  Multiple Vitamin (MULTI-VITAMIN) tablet Take 1 tablet by mouth daily.   Yes [provider]  albuterol (VENTOLIN HFA) 108 (90 Base) MCG/ACT inhaler Inhale 1-2 puffs into the lungs every 4 (four) hours as needed for wheezing or shortness of breath (or cough). Patient not taking: Reported on 04/02/2024 06/06/22   [provider]  benzonatate  (TESSALON ) 200 MG capsule Take 1 capsule (200 mg total) by mouth 3 (three) times daily as needed for cough. Patient not taking: Reported on 04/02/2024 12/26/23   Alvia Selinda PARAS, MD  bisacodyl  (DULCOLAX) 5 MG EC tablet Take 1 tablet (5 mg total) by mouth daily as needed for moderate constipation. Patient not taking: Reported on 04/02/2024 11/01/21   Alvia Selinda PARAS, MD  promethazine -dextromethorphan (PROMETHAZINE -DM) 6.25-15 MG/5ML syrup Take 5 mLs by mouth 4 (four) times daily as needed for cough. Patient not taking: Reported on 04/02/2024 12/21/23   Alvia Selinda PARAS, MD   No Known Allergies  FAMILY HISTORY:  family history includes Anemia in his mother; Cancer in his brother and father; Early death in his brother and  father; Heart disease in his maternal grandmother. SOCIAL HISTORY:  reports that he quit smoking about 5 years ago. His smoking use included cigarettes. He has never used smokeless tobacco. He reports that he does not drink alcohol and does not use drugs.   Review of Systems:  Gen:  Denies  fever, sweats, chills weight loss  HEENT: Denies blurred vision, double vision, ear pain, eye pain, hearing loss, nose bleeds, sore throat Cardiac:  No dizziness, chest pain or heaviness, chest tightness,edema, No JVD Resp:   No cough, -sputum production, -shortness of breath,-wheezing, -hemoptysis,  Gi: Denies swallowing difficulty, stomach pain, nausea or vomiting, diarrhea, constipation, bowel incontinence Gu:  Denies bladder incontinence, burning urine Ext:   Denies Joint pain, stiffness or swelling Skin: Denies  skin rash, easy bruising or bleeding or hives Endoc:  Denies polyuria, polydipsia , polyphagia or weight change Psych:   Denies depression, insomnia or hallucinations  Other:  All other systems negative  VITAL SIGNS: BP 130/80   Pulse 84   Temp 97.9 F (36.6 C)   Ht 5' 10 (1.778 m)   Wt 222 lb 3.2 oz (100.8 kg)   SpO2 97%   BMI 31.88 kg/m    Physical Examination:   General Appearance: No distress  EYES PERRLA, EOM intact.   NECK Supple, No JVD Pulmonary: normal breath sounds, No wheezing.  CardiovascularNormal S1,S2.  No m/r/g.   Abdomen: Benign, Soft, non-tender. Skin:   warm, no rashes, no ecchymosis  Extremities: normal, no cyanosis, clubbing. Neuro:without focal findings,  speech normal  PSYCHIATRIC: Mood, affect within normal limits.   ASSESSMENT AND PLAN  OSA To improve comfort and compliance, will decrease CPAP pressure range to 4-10 cm H2O and will try him on the Airtouch N30i nasal mask. Discussed the consequences of untreated sleep apnea. Advised not to drive drowsy for safety of patient and others. Will follow up in 3 months.    Insufficient sleep  syndrome Counseled patient on sleep hygiene and increasing total sleep time to 7-8 hours per night.     Patient  satisfied with Plan of action and management. All questions answered  I spent a total of 36 minutes reviewing chart data, face-to-face evaluation with the patient, counseling and coordination of care as detailed above.    Denetria Luevanos, M.D.  Sleep Medicine  Pulmonary & Critical Care Medicine

## 2024-05-08 ENCOUNTER — Encounter: Payer: Self-pay | Admitting: Family Medicine

## 2024-05-09 NOTE — Telephone Encounter (Signed)
 Please review. JM

## 2024-05-09 NOTE — Telephone Encounter (Signed)
 PT response.  JM

## 2024-07-02 ENCOUNTER — Ambulatory Visit: Admitting: Sleep Medicine

## 2024-10-15 ENCOUNTER — Encounter: Admitting: Family Medicine
